# Patient Record
Sex: Male | Born: 2000 | Race: White | Hispanic: No | Marital: Single | Smoking: Never smoker
Health system: Southern US, Community
[De-identification: ages and names within clinical notes are randomized; demographics above are authoritative.]

## PROBLEM LIST (undated history)

## (undated) DIAGNOSIS — F32A Depression, unspecified: Secondary | ICD-10-CM

## (undated) DIAGNOSIS — F419 Anxiety disorder, unspecified: Secondary | ICD-10-CM

## (undated) HISTORY — PX: HERNIA REPAIR: SHX51

## (undated) HISTORY — DX: Depression, unspecified: F32.A

## (undated) HISTORY — DX: Anxiety disorder, unspecified: F41.9

## (undated) HISTORY — PX: ELBOW FRACTURE SURGERY: SHX616

---
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
## 2021-01-10 DIAGNOSIS — R1084 Generalized abdominal pain: Secondary | ICD-10-CM | POA: Diagnosis not present

## 2021-01-10 DIAGNOSIS — B360 Pityriasis versicolor: Secondary | ICD-10-CM | POA: Diagnosis not present

## 2021-01-10 DIAGNOSIS — R1032 Left lower quadrant pain: Secondary | ICD-10-CM | POA: Diagnosis not present

## 2021-01-14 DIAGNOSIS — S20314A Abrasion of middle front wall of thorax, initial encounter: Secondary | ICD-10-CM | POA: Diagnosis not present

## 2021-01-14 DIAGNOSIS — S299XXA Unspecified injury of thorax, initial encounter: Secondary | ICD-10-CM | POA: Diagnosis not present

## 2021-01-14 DIAGNOSIS — S199XXA Unspecified injury of neck, initial encounter: Secondary | ICD-10-CM | POA: Diagnosis not present

## 2021-01-14 DIAGNOSIS — S3993XA Unspecified injury of pelvis, initial encounter: Secondary | ICD-10-CM | POA: Diagnosis not present

## 2021-01-14 DIAGNOSIS — M25531 Pain in right wrist: Secondary | ICD-10-CM | POA: Diagnosis not present

## 2021-01-14 DIAGNOSIS — R52 Pain, unspecified: Secondary | ICD-10-CM | POA: Diagnosis not present

## 2021-01-14 DIAGNOSIS — S42411A Displaced simple supracondylar fracture without intercondylar fracture of right humerus, initial encounter for closed fracture: Secondary | ICD-10-CM | POA: Diagnosis not present

## 2021-01-14 DIAGNOSIS — S62514A Nondisplaced fracture of proximal phalanx of right thumb, initial encounter for closed fracture: Secondary | ICD-10-CM | POA: Diagnosis not present

## 2021-01-14 DIAGNOSIS — S52501A Unspecified fracture of the lower end of right radius, initial encounter for closed fracture: Secondary | ICD-10-CM | POA: Diagnosis not present

## 2021-01-14 DIAGNOSIS — M79644 Pain in right finger(s): Secondary | ICD-10-CM | POA: Diagnosis not present

## 2021-01-14 DIAGNOSIS — S3992XA Unspecified injury of lower back, initial encounter: Secondary | ICD-10-CM | POA: Diagnosis not present

## 2021-01-14 DIAGNOSIS — S53104A Unspecified dislocation of right ulnohumeral joint, initial encounter: Secondary | ICD-10-CM | POA: Diagnosis not present

## 2021-01-14 DIAGNOSIS — S40211A Abrasion of right shoulder, initial encounter: Secondary | ICD-10-CM | POA: Diagnosis not present

## 2021-01-14 DIAGNOSIS — S3991XA Unspecified injury of abdomen, initial encounter: Secondary | ICD-10-CM | POA: Diagnosis not present

## 2021-01-14 DIAGNOSIS — S62511A Displaced fracture of proximal phalanx of right thumb, initial encounter for closed fracture: Secondary | ICD-10-CM | POA: Diagnosis not present

## 2021-01-14 DIAGNOSIS — M25551 Pain in right hip: Secondary | ICD-10-CM | POA: Diagnosis not present

## 2021-01-14 DIAGNOSIS — S42491A Other displaced fracture of lower end of right humerus, initial encounter for closed fracture: Secondary | ICD-10-CM | POA: Diagnosis not present

## 2021-01-14 DIAGNOSIS — R0689 Other abnormalities of breathing: Secondary | ICD-10-CM | POA: Diagnosis not present

## 2021-01-14 DIAGNOSIS — M79641 Pain in right hand: Secondary | ICD-10-CM | POA: Diagnosis not present

## 2021-01-14 DIAGNOSIS — M25511 Pain in right shoulder: Secondary | ICD-10-CM | POA: Diagnosis not present

## 2021-01-14 DIAGNOSIS — Z20822 Contact with and (suspected) exposure to covid-19: Secondary | ICD-10-CM | POA: Diagnosis not present

## 2021-01-14 DIAGNOSIS — S0081XA Abrasion of other part of head, initial encounter: Secondary | ICD-10-CM | POA: Diagnosis not present

## 2021-01-14 DIAGNOSIS — I959 Hypotension, unspecified: Secondary | ICD-10-CM | POA: Diagnosis not present

## 2021-01-14 DIAGNOSIS — M25572 Pain in left ankle and joints of left foot: Secondary | ICD-10-CM | POA: Diagnosis not present

## 2021-01-14 DIAGNOSIS — S0990XA Unspecified injury of head, initial encounter: Secondary | ICD-10-CM | POA: Diagnosis not present

## 2021-01-14 DIAGNOSIS — M25521 Pain in right elbow: Secondary | ICD-10-CM | POA: Diagnosis not present

## 2021-01-18 DIAGNOSIS — S60511A Abrasion of right hand, initial encounter: Secondary | ICD-10-CM | POA: Diagnosis not present

## 2021-01-18 DIAGNOSIS — S52209A Unspecified fracture of shaft of unspecified ulna, initial encounter for closed fracture: Secondary | ICD-10-CM | POA: Diagnosis not present

## 2021-01-18 DIAGNOSIS — S62511A Displaced fracture of proximal phalanx of right thumb, initial encounter for closed fracture: Secondary | ICD-10-CM | POA: Diagnosis not present

## 2021-01-18 DIAGNOSIS — S52201A Unspecified fracture of shaft of right ulna, initial encounter for closed fracture: Secondary | ICD-10-CM | POA: Diagnosis not present

## 2021-01-18 DIAGNOSIS — S42401A Unspecified fracture of lower end of right humerus, initial encounter for closed fracture: Secondary | ICD-10-CM | POA: Diagnosis not present

## 2021-01-18 DIAGNOSIS — T148XXA Other injury of unspecified body region, initial encounter: Secondary | ICD-10-CM | POA: Diagnosis not present

## 2021-01-19 DIAGNOSIS — S42411A Displaced simple supracondylar fracture without intercondylar fracture of right humerus, initial encounter for closed fracture: Secondary | ICD-10-CM | POA: Diagnosis not present

## 2021-01-25 DIAGNOSIS — S42411A Displaced simple supracondylar fracture without intercondylar fracture of right humerus, initial encounter for closed fracture: Secondary | ICD-10-CM | POA: Diagnosis not present

## 2021-02-07 DIAGNOSIS — Z419 Encounter for procedure for purposes other than remedying health state, unspecified: Secondary | ICD-10-CM | POA: Diagnosis not present

## 2021-02-09 DIAGNOSIS — M25641 Stiffness of right hand, not elsewhere classified: Secondary | ICD-10-CM | POA: Diagnosis not present

## 2021-02-09 DIAGNOSIS — M25621 Stiffness of right elbow, not elsewhere classified: Secondary | ICD-10-CM | POA: Diagnosis not present

## 2021-02-09 DIAGNOSIS — Z4789 Encounter for other orthopedic aftercare: Secondary | ICD-10-CM | POA: Diagnosis not present

## 2021-02-09 DIAGNOSIS — S42411D Displaced simple supracondylar fracture without intercondylar fracture of right humerus, subsequent encounter for fracture with routine healing: Secondary | ICD-10-CM | POA: Diagnosis not present

## 2021-02-09 DIAGNOSIS — Z4689 Encounter for fitting and adjustment of other specified devices: Secondary | ICD-10-CM | POA: Diagnosis not present

## 2021-02-09 DIAGNOSIS — Z9889 Other specified postprocedural states: Secondary | ICD-10-CM | POA: Diagnosis not present

## 2021-02-16 DIAGNOSIS — S42411D Displaced simple supracondylar fracture without intercondylar fracture of right humerus, subsequent encounter for fracture with routine healing: Secondary | ICD-10-CM | POA: Diagnosis not present

## 2021-02-16 DIAGNOSIS — M25621 Stiffness of right elbow, not elsewhere classified: Secondary | ICD-10-CM | POA: Diagnosis not present

## 2021-02-16 DIAGNOSIS — M25641 Stiffness of right hand, not elsewhere classified: Secondary | ICD-10-CM | POA: Diagnosis not present

## 2021-02-16 DIAGNOSIS — Z4689 Encounter for fitting and adjustment of other specified devices: Secondary | ICD-10-CM | POA: Diagnosis not present

## 2021-02-16 DIAGNOSIS — Z9889 Other specified postprocedural states: Secondary | ICD-10-CM | POA: Diagnosis not present

## 2021-02-23 DIAGNOSIS — M25641 Stiffness of right hand, not elsewhere classified: Secondary | ICD-10-CM | POA: Diagnosis not present

## 2021-02-23 DIAGNOSIS — Z9889 Other specified postprocedural states: Secondary | ICD-10-CM | POA: Diagnosis not present

## 2021-02-23 DIAGNOSIS — Z8781 Personal history of (healed) traumatic fracture: Secondary | ICD-10-CM | POA: Diagnosis not present

## 2021-02-23 DIAGNOSIS — S42401D Unspecified fracture of lower end of right humerus, subsequent encounter for fracture with routine healing: Secondary | ICD-10-CM | POA: Diagnosis not present

## 2021-02-23 DIAGNOSIS — M25621 Stiffness of right elbow, not elsewhere classified: Secondary | ICD-10-CM | POA: Diagnosis not present

## 2021-02-23 DIAGNOSIS — S42411D Displaced simple supracondylar fracture without intercondylar fracture of right humerus, subsequent encounter for fracture with routine healing: Secondary | ICD-10-CM | POA: Diagnosis not present

## 2021-02-23 DIAGNOSIS — Z4689 Encounter for fitting and adjustment of other specified devices: Secondary | ICD-10-CM | POA: Diagnosis not present

## 2021-03-01 DIAGNOSIS — M25641 Stiffness of right hand, not elsewhere classified: Secondary | ICD-10-CM | POA: Diagnosis not present

## 2021-03-01 DIAGNOSIS — Z9889 Other specified postprocedural states: Secondary | ICD-10-CM | POA: Diagnosis not present

## 2021-03-01 DIAGNOSIS — S42411D Displaced simple supracondylar fracture without intercondylar fracture of right humerus, subsequent encounter for fracture with routine healing: Secondary | ICD-10-CM | POA: Diagnosis not present

## 2021-03-01 DIAGNOSIS — Z4689 Encounter for fitting and adjustment of other specified devices: Secondary | ICD-10-CM | POA: Diagnosis not present

## 2021-03-01 DIAGNOSIS — M25621 Stiffness of right elbow, not elsewhere classified: Secondary | ICD-10-CM | POA: Diagnosis not present

## 2021-03-07 DIAGNOSIS — Z4689 Encounter for fitting and adjustment of other specified devices: Secondary | ICD-10-CM | POA: Diagnosis not present

## 2021-03-07 DIAGNOSIS — M25641 Stiffness of right hand, not elsewhere classified: Secondary | ICD-10-CM | POA: Diagnosis not present

## 2021-03-07 DIAGNOSIS — S42411D Displaced simple supracondylar fracture without intercondylar fracture of right humerus, subsequent encounter for fracture with routine healing: Secondary | ICD-10-CM | POA: Diagnosis not present

## 2021-03-07 DIAGNOSIS — M25621 Stiffness of right elbow, not elsewhere classified: Secondary | ICD-10-CM | POA: Diagnosis not present

## 2021-03-07 DIAGNOSIS — Z9889 Other specified postprocedural states: Secondary | ICD-10-CM | POA: Diagnosis not present

## 2021-03-09 DIAGNOSIS — Z419 Encounter for procedure for purposes other than remedying health state, unspecified: Secondary | ICD-10-CM | POA: Diagnosis not present

## 2021-03-09 DIAGNOSIS — Z9889 Other specified postprocedural states: Secondary | ICD-10-CM | POA: Diagnosis not present

## 2021-03-09 DIAGNOSIS — S42411D Displaced simple supracondylar fracture without intercondylar fracture of right humerus, subsequent encounter for fracture with routine healing: Secondary | ICD-10-CM | POA: Diagnosis not present

## 2021-03-09 DIAGNOSIS — M25621 Stiffness of right elbow, not elsewhere classified: Secondary | ICD-10-CM | POA: Diagnosis not present

## 2021-03-09 DIAGNOSIS — Z8781 Personal history of (healed) traumatic fracture: Secondary | ICD-10-CM | POA: Diagnosis not present

## 2021-03-09 DIAGNOSIS — S42401D Unspecified fracture of lower end of right humerus, subsequent encounter for fracture with routine healing: Secondary | ICD-10-CM | POA: Diagnosis not present

## 2021-03-09 DIAGNOSIS — M25641 Stiffness of right hand, not elsewhere classified: Secondary | ICD-10-CM | POA: Diagnosis not present

## 2021-03-09 DIAGNOSIS — Z4689 Encounter for fitting and adjustment of other specified devices: Secondary | ICD-10-CM | POA: Diagnosis not present

## 2021-03-13 ENCOUNTER — Ambulatory Visit: Payer: Medicaid Other | Admitting: Nurse Practitioner

## 2021-03-14 DIAGNOSIS — M25621 Stiffness of right elbow, not elsewhere classified: Secondary | ICD-10-CM | POA: Diagnosis not present

## 2021-03-14 DIAGNOSIS — Z9889 Other specified postprocedural states: Secondary | ICD-10-CM | POA: Diagnosis not present

## 2021-03-14 DIAGNOSIS — S42411D Displaced simple supracondylar fracture without intercondylar fracture of right humerus, subsequent encounter for fracture with routine healing: Secondary | ICD-10-CM | POA: Diagnosis not present

## 2021-03-14 DIAGNOSIS — Z4689 Encounter for fitting and adjustment of other specified devices: Secondary | ICD-10-CM | POA: Diagnosis not present

## 2021-03-14 DIAGNOSIS — M25641 Stiffness of right hand, not elsewhere classified: Secondary | ICD-10-CM | POA: Diagnosis not present

## 2021-03-16 DIAGNOSIS — M25641 Stiffness of right hand, not elsewhere classified: Secondary | ICD-10-CM | POA: Diagnosis not present

## 2021-03-16 DIAGNOSIS — M25621 Stiffness of right elbow, not elsewhere classified: Secondary | ICD-10-CM | POA: Diagnosis not present

## 2021-03-16 DIAGNOSIS — Z4689 Encounter for fitting and adjustment of other specified devices: Secondary | ICD-10-CM | POA: Diagnosis not present

## 2021-03-16 DIAGNOSIS — Z9889 Other specified postprocedural states: Secondary | ICD-10-CM | POA: Diagnosis not present

## 2021-03-16 DIAGNOSIS — S42411D Displaced simple supracondylar fracture without intercondylar fracture of right humerus, subsequent encounter for fracture with routine healing: Secondary | ICD-10-CM | POA: Diagnosis not present

## 2021-03-20 ENCOUNTER — Ambulatory Visit: Payer: Medicaid Other | Admitting: Nurse Practitioner

## 2021-03-20 ENCOUNTER — Other Ambulatory Visit: Payer: Self-pay

## 2021-03-20 ENCOUNTER — Encounter: Payer: Self-pay | Admitting: Nurse Practitioner

## 2021-03-20 VITALS — BP 110/70 | HR 98 | Temp 97.7°F | Resp 18 | Ht 71.0 in | Wt 162.3 lb

## 2021-03-20 DIAGNOSIS — Z7689 Persons encountering health services in other specified circumstances: Secondary | ICD-10-CM

## 2021-03-20 DIAGNOSIS — F341 Dysthymic disorder: Secondary | ICD-10-CM | POA: Diagnosis not present

## 2021-03-20 MED ORDER — SERTRALINE HCL 25 MG PO TABS: 25.0000 mg | ORAL_TABLET | Freq: Every day | ORAL | 0 refills | Status: DC
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
## 2021-03-21 DIAGNOSIS — Z4689 Encounter for fitting and adjustment of other specified devices: Secondary | ICD-10-CM | POA: Diagnosis not present

## 2021-03-21 DIAGNOSIS — S42411D Displaced simple supracondylar fracture without intercondylar fracture of right humerus, subsequent encounter for fracture with routine healing: Secondary | ICD-10-CM | POA: Diagnosis not present

## 2021-03-21 DIAGNOSIS — Z9889 Other specified postprocedural states: Secondary | ICD-10-CM | POA: Diagnosis not present

## 2021-03-21 DIAGNOSIS — M25621 Stiffness of right elbow, not elsewhere classified: Secondary | ICD-10-CM | POA: Diagnosis not present

## 2021-03-21 DIAGNOSIS — M25641 Stiffness of right hand, not elsewhere classified: Secondary | ICD-10-CM | POA: Diagnosis not present

## 2021-03-28 DIAGNOSIS — M25621 Stiffness of right elbow, not elsewhere classified: Secondary | ICD-10-CM | POA: Diagnosis not present

## 2021-03-28 DIAGNOSIS — Z4689 Encounter for fitting and adjustment of other specified devices: Secondary | ICD-10-CM | POA: Diagnosis not present

## 2021-03-28 DIAGNOSIS — M25641 Stiffness of right hand, not elsewhere classified: Secondary | ICD-10-CM | POA: Diagnosis not present

## 2021-03-28 DIAGNOSIS — S42411D Displaced simple supracondylar fracture without intercondylar fracture of right humerus, subsequent encounter for fracture with routine healing: Secondary | ICD-10-CM | POA: Diagnosis not present

## 2021-03-28 DIAGNOSIS — Z9889 Other specified postprocedural states: Secondary | ICD-10-CM | POA: Diagnosis not present

## 2021-03-29 ENCOUNTER — Other Ambulatory Visit: Payer: Self-pay

## 2021-03-29 ENCOUNTER — Encounter: Payer: Self-pay | Admitting: Nurse Practitioner

## 2021-03-29 ENCOUNTER — Ambulatory Visit: Payer: Medicaid Other | Admitting: Nurse Practitioner

## 2021-03-29 VITALS — BP 112/70 | HR 91 | Temp 97.6°F | Resp 18 | Ht 71.0 in | Wt 161.9 lb

## 2021-03-29 DIAGNOSIS — H6123 Impacted cerumen, bilateral: Secondary | ICD-10-CM

## 2021-03-29 DIAGNOSIS — H60392 Other infective otitis externa, left ear: Secondary | ICD-10-CM | POA: Diagnosis not present

## 2021-03-29 MED ORDER — OFLOXACIN 0.3 % OT SOLN: 10.0000 [drp] | Freq: Every day | OTIC | 0 refills | Status: AC

## 2021-03-29 NOTE — Progress Notes (Signed)
BP 112/70    Pulse 91    Temp 97.6 F (36.4 C) (Oral)    Resp 18    Ht 5\' 11"  (1.803 m)    Wt 161 lb 14.4 oz (73.4 kg)    SpO2 99%    BMI 22.58 kg/m    Subjective:    Patient ID: Cesar Peterson, male    DOB: 2001/02/14, 20 y.o.   MRN: [id number]  HPI: Cesar Peterson is a 20 y.o. male, here alone  Chief Complaint  Patient presents with   Ear Fullness    Left ear   Left ear pain:  He says he noticed his left ear felt full with some pain and sounds were muffled.  Ear is impacted with cerumen. After performing ear lavage. He said that he had a bug climb in his ear last night.  There was no sign of a bug in his ear but it was red and irritated.  Will do antibiotic ear drops.   Right Ear:  He had a small amount of wax in his right ear and he would like 12 to do an ear lavage to that side too.    Relevant past medical, surgical, family and social history reviewed and updated as indicated. Interim medical history since our last visit reviewed. Allergies and medications reviewed and updated.  Review of Systems  Constitutional: Negative for fever or weight change.  HEENT: positive left ear pain Respiratory: Negative for cough and shortness of breath.   Cardiovascular: Negative for chest pain or palpitations.  Gastrointestinal: Negative for abdominal pain, no bowel changes.  Musculoskeletal: Negative for gait problem or joint swelling.  Skin: Negative for rash.  Neurological: Negative for dizziness or headache.  No other specific complaints in a complete review of systems (except as listed in HPI above).      Objective:    BP 112/70    Pulse 91    Temp 97.6 F (36.4 C) (Oral)    Resp 18    Ht 5\' 11"  (1.803 m)    Wt 161 lb 14.4 oz (73.4 kg)    SpO2 99%    BMI 22.58 kg/m   Wt Readings from Last 3 Encounters:  03/29/21 161 lb 14.4 oz (73.4 kg) (62 %, Z= 0.31)*  03/20/21 162 lb 4.8 oz (73.6 kg) (63 %, Z= 0.33)*  11/24/17 188 lb 2 oz (85.3 kg) (96 %, Z= 1.70)*   * Growth  percentiles are based on CDC (Boys, 2-20 Years) data.    Physical Exam  Constitutional: Patient appears well-developed and well-nourished. No distress.  HEENT: head atraumatic, normocephalic, pupils equal and reactive to light, ears right TM clear small amount of cerumen noted, left TM impacted with cerumen , after irrigation TM was clear and intact, ear canal was red and swollen, neck supple, throat within normal limits Cardiovascular: Normal rate, regular rhythm and normal heart sounds.  No murmur heard. No BLE edema. Pulmonary/Chest: Effort normal and breath sounds normal. No respiratory distress. Abdominal: Soft.  There is no tenderness. Psychiatric: Patient has a normal mood and affect. behavior is normal. Judgment and thought content normal.     Assessment & Plan:   1. Bilateral impacted cerumen - Ear Lavage  2. Other infective acute otitis externa of left ear  - ofloxacin (FLOXIN OTIC) 0.3 % OTIC solution; Place 10 drops into the left ear daily for 7 days.  Dispense: 3.5 mL; Refill: 0   Follow up plan: Return if symptoms worsen or  fail to improve.

## 2021-03-30 DIAGNOSIS — S42411D Displaced simple supracondylar fracture without intercondylar fracture of right humerus, subsequent encounter for fracture with routine healing: Secondary | ICD-10-CM | POA: Diagnosis not present

## 2021-03-30 DIAGNOSIS — Z4689 Encounter for fitting and adjustment of other specified devices: Secondary | ICD-10-CM | POA: Diagnosis not present

## 2021-03-30 DIAGNOSIS — M25621 Stiffness of right elbow, not elsewhere classified: Secondary | ICD-10-CM | POA: Diagnosis not present

## 2021-03-30 DIAGNOSIS — M25641 Stiffness of right hand, not elsewhere classified: Secondary | ICD-10-CM | POA: Diagnosis not present

## 2021-03-30 DIAGNOSIS — Z9889 Other specified postprocedural states: Secondary | ICD-10-CM | POA: Diagnosis not present

## 2021-04-04 DIAGNOSIS — M25641 Stiffness of right hand, not elsewhere classified: Secondary | ICD-10-CM | POA: Diagnosis not present

## 2021-04-04 DIAGNOSIS — S42411D Displaced simple supracondylar fracture without intercondylar fracture of right humerus, subsequent encounter for fracture with routine healing: Secondary | ICD-10-CM | POA: Diagnosis not present

## 2021-04-04 DIAGNOSIS — Z9889 Other specified postprocedural states: Secondary | ICD-10-CM | POA: Diagnosis not present

## 2021-04-04 DIAGNOSIS — Z4689 Encounter for fitting and adjustment of other specified devices: Secondary | ICD-10-CM | POA: Diagnosis not present

## 2021-04-04 DIAGNOSIS — M25621 Stiffness of right elbow, not elsewhere classified: Secondary | ICD-10-CM | POA: Diagnosis not present

## 2021-04-06 DIAGNOSIS — M25641 Stiffness of right hand, not elsewhere classified: Secondary | ICD-10-CM | POA: Diagnosis not present

## 2021-04-06 DIAGNOSIS — M25621 Stiffness of right elbow, not elsewhere classified: Secondary | ICD-10-CM | POA: Diagnosis not present

## 2021-04-06 DIAGNOSIS — S42411D Displaced simple supracondylar fracture without intercondylar fracture of right humerus, subsequent encounter for fracture with routine healing: Secondary | ICD-10-CM | POA: Diagnosis not present

## 2021-04-06 DIAGNOSIS — Z4689 Encounter for fitting and adjustment of other specified devices: Secondary | ICD-10-CM | POA: Diagnosis not present

## 2021-04-06 DIAGNOSIS — Z9889 Other specified postprocedural states: Secondary | ICD-10-CM | POA: Diagnosis not present

## 2021-04-09 DIAGNOSIS — Z419 Encounter for procedure for purposes other than remedying health state, unspecified: Secondary | ICD-10-CM | POA: Diagnosis not present

## 2021-04-13 DIAGNOSIS — S42411D Displaced simple supracondylar fracture without intercondylar fracture of right humerus, subsequent encounter for fracture with routine healing: Secondary | ICD-10-CM | POA: Diagnosis not present

## 2021-04-13 DIAGNOSIS — Z9889 Other specified postprocedural states: Secondary | ICD-10-CM | POA: Diagnosis not present

## 2021-04-13 DIAGNOSIS — M25621 Stiffness of right elbow, not elsewhere classified: Secondary | ICD-10-CM | POA: Diagnosis not present

## 2021-04-13 DIAGNOSIS — M25641 Stiffness of right hand, not elsewhere classified: Secondary | ICD-10-CM | POA: Diagnosis not present

## 2021-04-13 DIAGNOSIS — Z4689 Encounter for fitting and adjustment of other specified devices: Secondary | ICD-10-CM | POA: Diagnosis not present

## 2021-04-18 DIAGNOSIS — S42411D Displaced simple supracondylar fracture without intercondylar fracture of right humerus, subsequent encounter for fracture with routine healing: Secondary | ICD-10-CM | POA: Diagnosis not present

## 2021-04-18 DIAGNOSIS — Z4689 Encounter for fitting and adjustment of other specified devices: Secondary | ICD-10-CM | POA: Diagnosis not present

## 2021-04-18 DIAGNOSIS — Z9889 Other specified postprocedural states: Secondary | ICD-10-CM | POA: Diagnosis not present

## 2021-04-18 DIAGNOSIS — M25621 Stiffness of right elbow, not elsewhere classified: Secondary | ICD-10-CM | POA: Diagnosis not present

## 2021-04-18 DIAGNOSIS — M25641 Stiffness of right hand, not elsewhere classified: Secondary | ICD-10-CM | POA: Diagnosis not present

## 2021-04-20 ENCOUNTER — Encounter: Payer: Self-pay | Admitting: Nurse Practitioner

## 2021-04-20 ENCOUNTER — Ambulatory Visit: Payer: Medicaid Other | Admitting: Nurse Practitioner

## 2021-04-20 VITALS — BP 122/78 | HR 98 | Temp 97.7°F | Resp 16 | Ht 71.0 in | Wt 160.6 lb

## 2021-04-20 DIAGNOSIS — F341 Dysthymic disorder: Secondary | ICD-10-CM

## 2021-04-20 DIAGNOSIS — F419 Anxiety disorder, unspecified: Secondary | ICD-10-CM | POA: Diagnosis not present

## 2021-04-20 DIAGNOSIS — L03012 Cellulitis of left finger: Secondary | ICD-10-CM | POA: Diagnosis not present

## 2021-04-20 MED ORDER — CEPHALEXIN 500 MG PO CAPS: 500.0000 mg | ORAL_CAPSULE | Freq: Four times a day (QID) | ORAL | 0 refills | Status: AC

## 2021-04-20 MED ORDER — SERTRALINE HCL 50 MG PO TABS: 50.0000 mg | ORAL_TABLET | Freq: Every day | ORAL | 0 refills | Status: DC
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
## 2021-04-27 DIAGNOSIS — Z4689 Encounter for fitting and adjustment of other specified devices: Secondary | ICD-10-CM | POA: Diagnosis not present

## 2021-04-27 DIAGNOSIS — S42411D Displaced simple supracondylar fracture without intercondylar fracture of right humerus, subsequent encounter for fracture with routine healing: Secondary | ICD-10-CM | POA: Diagnosis not present

## 2021-04-27 DIAGNOSIS — Z9889 Other specified postprocedural states: Secondary | ICD-10-CM | POA: Diagnosis not present

## 2021-04-27 DIAGNOSIS — M25641 Stiffness of right hand, not elsewhere classified: Secondary | ICD-10-CM | POA: Diagnosis not present

## 2021-04-27 DIAGNOSIS — M25621 Stiffness of right elbow, not elsewhere classified: Secondary | ICD-10-CM | POA: Diagnosis not present

## 2021-04-27 DIAGNOSIS — Z8781 Personal history of (healed) traumatic fracture: Secondary | ICD-10-CM | POA: Diagnosis not present

## 2021-04-27 DIAGNOSIS — S42401A Unspecified fracture of lower end of right humerus, initial encounter for closed fracture: Secondary | ICD-10-CM | POA: Diagnosis not present

## 2021-05-02 DIAGNOSIS — S42411D Displaced simple supracondylar fracture without intercondylar fracture of right humerus, subsequent encounter for fracture with routine healing: Secondary | ICD-10-CM | POA: Diagnosis not present

## 2021-05-02 DIAGNOSIS — M25621 Stiffness of right elbow, not elsewhere classified: Secondary | ICD-10-CM | POA: Diagnosis not present

## 2021-05-02 DIAGNOSIS — Z9889 Other specified postprocedural states: Secondary | ICD-10-CM | POA: Diagnosis not present

## 2021-05-02 DIAGNOSIS — M25641 Stiffness of right hand, not elsewhere classified: Secondary | ICD-10-CM | POA: Diagnosis not present

## 2021-05-02 DIAGNOSIS — Z4689 Encounter for fitting and adjustment of other specified devices: Secondary | ICD-10-CM | POA: Diagnosis not present

## 2021-05-04 DIAGNOSIS — M25621 Stiffness of right elbow, not elsewhere classified: Secondary | ICD-10-CM | POA: Diagnosis not present

## 2021-05-04 DIAGNOSIS — Z9889 Other specified postprocedural states: Secondary | ICD-10-CM | POA: Diagnosis not present

## 2021-05-04 DIAGNOSIS — M25641 Stiffness of right hand, not elsewhere classified: Secondary | ICD-10-CM | POA: Diagnosis not present

## 2021-05-04 DIAGNOSIS — Z4689 Encounter for fitting and adjustment of other specified devices: Secondary | ICD-10-CM | POA: Diagnosis not present

## 2021-05-04 DIAGNOSIS — S42411D Displaced simple supracondylar fracture without intercondylar fracture of right humerus, subsequent encounter for fracture with routine healing: Secondary | ICD-10-CM | POA: Diagnosis not present

## 2021-05-10 DIAGNOSIS — Z419 Encounter for procedure for purposes other than remedying health state, unspecified: Secondary | ICD-10-CM | POA: Diagnosis not present

## 2021-05-18 ENCOUNTER — Ambulatory Visit: Payer: Medicaid Other | Admitting: Nurse Practitioner

## 2021-05-18 ENCOUNTER — Other Ambulatory Visit: Payer: Self-pay

## 2021-05-18 ENCOUNTER — Encounter: Payer: Self-pay | Admitting: Nurse Practitioner

## 2021-05-18 VITALS — BP 118/72 | HR 92 | Temp 97.8°F | Resp 18 | Ht 71.0 in | Wt 159.0 lb

## 2021-05-18 DIAGNOSIS — M25621 Stiffness of right elbow, not elsewhere classified: Secondary | ICD-10-CM | POA: Diagnosis not present

## 2021-05-18 DIAGNOSIS — F329 Major depressive disorder, single episode, unspecified: Secondary | ICD-10-CM | POA: Diagnosis not present

## 2021-05-18 DIAGNOSIS — F419 Anxiety disorder, unspecified: Secondary | ICD-10-CM | POA: Diagnosis not present

## 2021-05-18 DIAGNOSIS — Z9889 Other specified postprocedural states: Secondary | ICD-10-CM | POA: Diagnosis not present

## 2021-05-18 DIAGNOSIS — S42411D Displaced simple supracondylar fracture without intercondylar fracture of right humerus, subsequent encounter for fracture with routine healing: Secondary | ICD-10-CM | POA: Diagnosis not present

## 2021-05-18 DIAGNOSIS — Z4689 Encounter for fitting and adjustment of other specified devices: Secondary | ICD-10-CM | POA: Diagnosis not present

## 2021-05-18 DIAGNOSIS — M25641 Stiffness of right hand, not elsewhere classified: Secondary | ICD-10-CM | POA: Diagnosis not present

## 2021-05-18 MED ORDER — DULOXETINE HCL 30 MG PO CPEP: 30.0000 mg | ORAL_CAPSULE | Freq: Every day | ORAL | 0 refills | Status: DC

## 2021-05-18 MED ORDER — BUSPIRONE HCL 5 MG PO TABS: 5.0000 mg | ORAL_TABLET | Freq: Three times a day (TID) | ORAL | 0 refills | Status: DC | PRN
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
## 2021-05-25 DIAGNOSIS — M25641 Stiffness of right hand, not elsewhere classified: Secondary | ICD-10-CM | POA: Diagnosis not present

## 2021-05-25 DIAGNOSIS — M25621 Stiffness of right elbow, not elsewhere classified: Secondary | ICD-10-CM | POA: Diagnosis not present

## 2021-05-25 DIAGNOSIS — S42411D Displaced simple supracondylar fracture without intercondylar fracture of right humerus, subsequent encounter for fracture with routine healing: Secondary | ICD-10-CM | POA: Diagnosis not present

## 2021-05-25 DIAGNOSIS — Z4689 Encounter for fitting and adjustment of other specified devices: Secondary | ICD-10-CM | POA: Diagnosis not present

## 2021-05-25 DIAGNOSIS — Z9889 Other specified postprocedural states: Secondary | ICD-10-CM | POA: Diagnosis not present

## 2021-06-01 DIAGNOSIS — Z4689 Encounter for fitting and adjustment of other specified devices: Secondary | ICD-10-CM | POA: Diagnosis not present

## 2021-06-01 DIAGNOSIS — S42411D Displaced simple supracondylar fracture without intercondylar fracture of right humerus, subsequent encounter for fracture with routine healing: Secondary | ICD-10-CM | POA: Diagnosis not present

## 2021-06-01 DIAGNOSIS — Z9889 Other specified postprocedural states: Secondary | ICD-10-CM | POA: Diagnosis not present

## 2021-06-01 DIAGNOSIS — M25621 Stiffness of right elbow, not elsewhere classified: Secondary | ICD-10-CM | POA: Diagnosis not present

## 2021-06-01 DIAGNOSIS — M25641 Stiffness of right hand, not elsewhere classified: Secondary | ICD-10-CM | POA: Diagnosis not present

## 2021-06-07 DIAGNOSIS — Z419 Encounter for procedure for purposes other than remedying health state, unspecified: Secondary | ICD-10-CM | POA: Diagnosis not present

## 2021-06-08 DIAGNOSIS — S42411D Displaced simple supracondylar fracture without intercondylar fracture of right humerus, subsequent encounter for fracture with routine healing: Secondary | ICD-10-CM | POA: Diagnosis not present

## 2021-06-08 DIAGNOSIS — M25621 Stiffness of right elbow, not elsewhere classified: Secondary | ICD-10-CM | POA: Diagnosis not present

## 2021-06-08 DIAGNOSIS — M25641 Stiffness of right hand, not elsewhere classified: Secondary | ICD-10-CM | POA: Diagnosis not present

## 2021-06-29 DIAGNOSIS — M25621 Stiffness of right elbow, not elsewhere classified: Secondary | ICD-10-CM | POA: Diagnosis not present

## 2021-06-29 DIAGNOSIS — S42411D Displaced simple supracondylar fracture without intercondylar fracture of right humerus, subsequent encounter for fracture with routine healing: Secondary | ICD-10-CM | POA: Diagnosis not present

## 2021-06-29 DIAGNOSIS — M25641 Stiffness of right hand, not elsewhere classified: Secondary | ICD-10-CM | POA: Diagnosis not present

## 2021-07-03 ENCOUNTER — Other Ambulatory Visit: Payer: Self-pay | Admitting: Nurse Practitioner

## 2021-07-03 DIAGNOSIS — F419 Anxiety disorder, unspecified: Secondary | ICD-10-CM

## 2021-07-03 DIAGNOSIS — F329 Major depressive disorder, single episode, unspecified: Secondary | ICD-10-CM

## 2021-07-03 MED ORDER — BUSPIRONE HCL 5 MG PO TABS: 5.0000 mg | ORAL_TABLET | Freq: Three times a day (TID) | ORAL | 1 refills | Status: DC | PRN

## 2021-07-03 MED ORDER — DULOXETINE HCL 60 MG PO CPEP: 60.0000 mg | ORAL_CAPSULE | Freq: Every day | ORAL | 1 refills | Status: DC

## 2021-07-06 ENCOUNTER — Other Ambulatory Visit: Payer: Self-pay

## 2021-07-06 ENCOUNTER — Encounter: Payer: Self-pay | Admitting: Nurse Practitioner

## 2021-07-06 ENCOUNTER — Ambulatory Visit: Payer: Medicaid Other | Admitting: Nurse Practitioner

## 2021-07-06 VITALS — BP 124/82 | HR 97 | Temp 97.6°F | Resp 18 | Ht 71.0 in | Wt 165.7 lb

## 2021-07-06 DIAGNOSIS — F5105 Insomnia due to other mental disorder: Secondary | ICD-10-CM

## 2021-07-06 DIAGNOSIS — F329 Major depressive disorder, single episode, unspecified: Secondary | ICD-10-CM | POA: Diagnosis not present

## 2021-07-06 DIAGNOSIS — F419 Anxiety disorder, unspecified: Secondary | ICD-10-CM

## 2021-07-06 DIAGNOSIS — F99 Mental disorder, not otherwise specified: Secondary | ICD-10-CM | POA: Diagnosis not present

## 2021-07-06 MED ORDER — HYDROXYZINE PAMOATE 25 MG PO CAPS: 25.0000 mg | ORAL_CAPSULE | Freq: Every evening | ORAL | 0 refills | Status: DC | PRN

## 2021-07-06 NOTE — Progress Notes (Signed)
? ?BP 124/82   Pulse 97   Temp 97.6 ?F (36.4 ?C) (Oral)   Resp 18   Ht 5\' 11"  (1.803 m)   Wt 165 lb 11.2 oz (75.2 kg)   SpO2 99%   BMI 23.11 kg/m?   ? ?Subjective:  ? ? Patient ID: Cesar Peterson, male    DOB: 11/06/2000, 21 y.o.   MRN: [id number] ? ?HPI: ?Cesar Peterson is a 21 y.o. male ? ?Chief Complaint  ?Patient presents with  ? Depression  ?  Follow up  ? ?Depression/ anxiety: He was last seen on 05/18/2021 at which time we switched him from Zoloft to Cymbalta 60 mg daily.  He was supposed to come back for a four week check but did not come, so he ran out of his medication.  He recently restarted it. We also started him on Buspar due to his anxiety about getting into a car. He says he has noticed a difference in his anxiety since starting the buspar. His PHQ9 score has improved slightly.  Will continue with current treatment since being out of medication for a while.  Follow-up in four weeks to see how he is doing.  ? ? ?  07/06/2021  ?  1:11 PM 05/18/2021  ? 10:25 AM 04/20/2021  ?  8:30 AM 03/29/2021  ?  1:47 PM 03/20/2021  ?  8:42 AM  ?Depression screen PHQ 2/9  ?Decreased Interest 3 2 0 1 1  ?Down, Depressed, Hopeless 3 2 3 3 3   ?PHQ - 2 Score 6 4 3 4 4   ?Altered sleeping 3 3 3 3 3   ?Tired, decreased energy 2 2 2 2 2   ?Change in appetite 2 3 3 3 2   ?Feeling bad or failure about yourself  2 3 3 3 2   ?Trouble concentrating 0 2 0 0 0  ?Moving slowly or fidgety/restless 0 0 0 0 0  ?Suicidal thoughts 0 2 1 1  0  ?PHQ-9 Score 15 19 15 16 13   ?Difficult doing work/chores Somewhat difficult Very difficult Somewhat difficult  Somewhat difficult  ?  ? ?  07/06/2021  ?  1:12 PM 05/18/2021  ? 10:26 AM 04/20/2021  ?  8:32 AM 03/29/2021  ?  1:48 PM  ?GAD 7 : Generalized Anxiety Score  ?Nervous, Anxious, on Edge 2 2 3 2   ?Control/stop worrying 3 2 3 3   ?Worry too much - different things 3 2 3 3   ?Trouble relaxing 1 1 1 2   ?Restless 0 1 0 0  ?Easily annoyed or irritable 2 1 0 0  ?Afraid - awful might happen 2 2 0 2  ?Total  GAD 7 Score 13 11 10 12   ?Anxiety Difficulty Somewhat difficult Very difficult Somewhat difficult Somewhat difficult  ? ? Insomnia:  He says he has been having a hard time falling asleep and staying asleep.  He says this has been an issues for awhile. He has tried melatonin. Discussed avoiding caffeine and staying off electronics about an hour before bed.  Will start hydroxyzine at bedtime.  ? ?Relevant past medical, surgical, family and social history reviewed and updated as indicated. Interim medical history since our last visit reviewed. ?Allergies and medications reviewed and updated. ? ?Review of Systems ? ?Constitutional: Negative for fever or weight change.  ?Respiratory: Negative for cough and shortness of breath.   ?Cardiovascular: Negative for chest pain or palpitations.  ?Gastrointestinal: Negative for abdominal pain, no bowel changes.  ?Musculoskeletal: Negative for gait problem or joint swelling.  ?  Skin: Negative for rash.  ?Neurological: Negative for dizziness or headache.  ?No other specific complaints in a complete review of systems (except as listed in HPI above).  ? ?   ?Objective:  ?  ?BP 124/82   Pulse 97   Temp 97.6 ?F (36.4 ?C) (Oral)   Resp 18   Ht 5\' 11"  (1.803 m)   Wt 165 lb 11.2 oz (75.2 kg)   SpO2 99%   BMI 23.11 kg/m?   ?Wt Readings from Last 3 Encounters:  ?07/06/21 165 lb 11.2 oz (75.2 kg) (66 %, Z= 0.42)*  ?05/18/21 159 lb (72.1 kg) (57 %, Z= 0.19)*  ?04/20/21 160 lb 9.6 oz (72.8 kg) (60 %, Z= 0.26)*  ? ?* Growth percentiles are based on CDC (Boys, 2-20 Years) data.  ?  ?Physical Exam ? ?Constitutional: Patient appears well-developed and well-nourished.  No distress.  ?HEENT: head atraumatic, normocephalic, pupils equal and reactive to light, neck supple ?Cardiovascular: Normal rate, regular rhythm and normal heart sounds.  No murmur heard. No BLE edema. ?Pulmonary/Chest: Effort normal and breath sounds normal. No respiratory distress. ?Abdominal: Soft.  There is no  tenderness. ?Psychiatric: Patient has a normal mood and affect. behavior is normal. Judgment and thought content normal.  ? ?   ?Assessment & Plan:  ? ?1. Major depressive disorder with current active episode, moderate depression episode severity, recurrent ?-continue Cymbalta ? ?2. Anxiety ? ?- hydrOXYzine (VISTARIL) 25 MG capsule; Take 1 capsule (25 mg total) by mouth at bedtime as needed for anxiety.  Dispense: 30 capsule; Refill: 0 ? ?3. Insomnia due to other mental disorder ? ?- hydrOXYzine (VISTARIL) 25 MG capsule; Take 1 capsule (25 mg total) by mouth at bedtime as needed for anxiety.  Dispense: 30 capsule; Refill: 0  ? ?Follow up plan: ?Return in about 4 weeks (around 08/03/2021) for follow up. ? ? ? ? ? ?

## 2021-07-08 DIAGNOSIS — Z419 Encounter for procedure for purposes other than remedying health state, unspecified: Secondary | ICD-10-CM | POA: Diagnosis not present

## 2021-07-29 ENCOUNTER — Other Ambulatory Visit: Payer: Self-pay | Admitting: Nurse Practitioner

## 2021-07-29 DIAGNOSIS — F99 Mental disorder, not otherwise specified: Secondary | ICD-10-CM

## 2021-07-29 DIAGNOSIS — F419 Anxiety disorder, unspecified: Secondary | ICD-10-CM

## 2021-07-31 NOTE — Telephone Encounter (Signed)
Requested medication (s) are due for refill today:Due 08/06/21 ? ?Requested medication (s) are on the active medication list: yes   ? ?Last refill: 07/06/21  #30 0 refills ? ?Future visit scheduled no ? ?Notes to clinic:failed due to labs, please review. Thank you. ? ?Requested Prescriptions  ?Pending Prescriptions Disp Refills  ? hydrOXYzine (VISTARIL) 25 MG capsule [Pharmacy Med Name: hydrOXYzine Pamoate 25 MG Oral Capsule] 30 capsule 0  ?  Sig: TAKE 1 CAPSULE BY MOUTH AT BEDTIME AS NEEDED FOR ANXIETY  ?  ? Ear, Nose, and Throat:  Antihistamines 2 Failed - 07/29/2021  2:12 PM  ?  ?  Failed - Cr in normal range and within 360 days  ?  No results found for: CREATININE, LABCREAU, LABCREA, POCCRE  ?  ?  ?  Passed - Valid encounter within last 12 months  ?  Recent Outpatient Visits   ? ?      ? 3 weeks ago Major depressive disorder with current active episode, moderate depression episode severity, recurrent  ? [place], FNP  ? 2 months ago Major depressive disorder with current active episode, moderate depression episode severity, recurrent  ? [place], FNP  ? 3 months ago Persistent depressive disorder  ? [place], FNP  ? 4 months ago Bilateral impacted cerumen  ? [place], FNP  ? 4 months ago Persistent depressive disorder  ? [facility] Serafina Royals F, FNP  ? ?  ?  ? ? ?  ?  ?  ? ? ? ? ?

## 2021-08-07 DIAGNOSIS — Z419 Encounter for procedure for purposes other than remedying health state, unspecified: Secondary | ICD-10-CM | POA: Diagnosis not present

## 2021-08-18 ENCOUNTER — Ambulatory Visit (INDEPENDENT_AMBULATORY_CARE_PROVIDER_SITE_OTHER): Payer: Medicaid Other | Admitting: Nurse Practitioner

## 2021-08-18 ENCOUNTER — Other Ambulatory Visit: Payer: Self-pay

## 2021-08-18 ENCOUNTER — Encounter: Payer: Self-pay | Admitting: Nurse Practitioner

## 2021-08-18 VITALS — BP 120/84 | HR 99 | Temp 97.9°F | Resp 18 | Ht 71.0 in | Wt 170.1 lb

## 2021-08-18 DIAGNOSIS — F419 Anxiety disorder, unspecified: Secondary | ICD-10-CM | POA: Diagnosis not present

## 2021-08-18 DIAGNOSIS — R1032 Left lower quadrant pain: Secondary | ICD-10-CM | POA: Diagnosis not present

## 2021-08-18 DIAGNOSIS — F99 Mental disorder, not otherwise specified: Secondary | ICD-10-CM | POA: Insufficient documentation

## 2021-08-18 DIAGNOSIS — F329 Major depressive disorder, single episode, unspecified: Secondary | ICD-10-CM

## 2021-08-18 DIAGNOSIS — F5105 Insomnia due to other mental disorder: Secondary | ICD-10-CM | POA: Diagnosis not present

## 2021-08-18 MED ORDER — DULOXETINE HCL 20 MG PO CPEP: 20.0000 mg | ORAL_CAPSULE | Freq: Every day | ORAL | 0 refills | Status: DC

## 2021-08-18 MED ORDER — BUSPIRONE HCL 5 MG PO TABS: 5.0000 mg | ORAL_TABLET | Freq: Three times a day (TID) | ORAL | 1 refills | Status: DC | PRN

## 2021-08-18 MED ORDER — DULOXETINE HCL 60 MG PO CPEP: 60.0000 mg | ORAL_CAPSULE | Freq: Every day | ORAL | 0 refills | Status: DC

## 2021-08-18 MED ORDER — HYDROXYZINE PAMOATE 25 MG PO CAPS: ORAL_CAPSULE | ORAL | 1 refills | Status: DC

## 2021-08-18 NOTE — Progress Notes (Signed)
? ?BP 120/84   Pulse 99   Temp 97.9 ?F (36.6 ?C) (Oral)   Resp 18   Ht 5\' 11"  (1.803 m)   Wt 170 lb 1.6 oz (77.2 kg)   SpO2 99%   BMI 23.72 kg/m?   ? ?Subjective:  ? ? Patient ID: Cesar Peterson, male    DOB: 2001/01/29, 21 y.o.   MRN: [id number] ? ?HPI: ?Cesar Peterson is a 21 y.o. male ? ?Chief Complaint  ?Patient presents with  ? Depression  ?  6 week recheck  ? ?Depression/anxiety: Last seen on 07/06/2021.  Has been on Cymbalta 60 mg daily.  He is also taking BuSpar due to his anxiety about getting into a car.  He continues to notice a difference in his anxiety since starting the BuSpar.  His PHQ-9 score has not changed from last visit.  His GAD score has improved.  Will increase dose of Cymbalta to 80 mg daily. ? ?  08/18/2021  ?  8:32 AM 07/06/2021  ?  1:11 PM 05/18/2021  ? 10:25 AM 04/20/2021  ?  8:30 AM 03/29/2021  ?  1:47 PM  ?Depression screen PHQ 2/9  ?Decreased Interest 3 3 2  0 1  ?Down, Depressed, Hopeless 3 3 2 3 3   ?PHQ - 2 Score 6 6 4 3 4   ?Altered sleeping 3 3 3 3 3   ?Tired, decreased energy 2 2 2 2 2   ?Change in appetite 2 2 3 3 3   ?Feeling bad or failure about yourself  1 2 3 3 3   ?Trouble concentrating 0 0 2 0 0  ?Moving slowly or fidgety/restless 1 0 0 0 0  ?Suicidal thoughts  0 2 1 1   ?PHQ-9 Score 15 15 19 15 16   ?Difficult doing work/chores Very difficult Somewhat difficult Very difficult Somewhat difficult   ? ? ?  08/18/2021  ?  8:32 AM 07/06/2021  ?  1:12 PM 05/18/2021  ? 10:26 AM 04/20/2021  ?  8:32 AM  ?GAD 7 : Generalized Anxiety Score  ?Nervous, Anxious, on Edge 1 2 2 3   ?Control/stop worrying 2 3 2 3   ?Worry too much - different things 3 3 2 3   ?Trouble relaxing 1 1 1 1   ?Restless 1 0 1 0  ?Easily annoyed or irritable 1 2 1  0  ?Afraid - awful might happen 1 2 2  0  ?Total GAD 7 Score 10 13 11 10   ?Anxiety Difficulty Somewhat difficult Somewhat difficult Very difficult Somewhat difficult  ? ?Insomnia: At last visit he discussed that he was having a hard time falling asleep and staying  asleep.  He says that had been an issue for a while.  Melatonin but it did not help we had discussed avoiding caffeine and staying off electronics at about an hour before bed.  We started him on hydroxyzine at bedtime to see if that made a difference.  He says he has been sleeping better at night.  But not every night. ? ?Left groin pain/lump: Patient reports he has had a hernia in this area before.  Has had surgery previously.  He says he has had this lump for quite a while now.  He says is becoming more pronounced and is starting to give him some pain.  Denies any urinary symptoms, testicular pain or fever.  Placed referral to general surgery. ?  ?Relevant past medical, surgical, family and social history reviewed and updated as indicated. Interim medical history since our last visit reviewed. ?Allergies and  medications reviewed and updated. ? ?Review of Systems ? ?Constitutional: Negative for fever or weight change.  ?Respiratory: Negative for cough and shortness of breath.   ?Cardiovascular: Negative for chest pain or palpitations.  ?Gastrointestinal: Negative for abdominal pain, no bowel changes.  ?Musculoskeletal: Negative for gait problem or joint swelling.  ?Skin: Negative for rash.  ?Neurological: Negative for dizziness or headache.  ?No other specific complaints in a complete review of systems (except as listed in HPI above).  ? ?   ?Objective:  ?  ?BP 120/84   Pulse 99   Temp 97.9 ?F (36.6 ?C) (Oral)   Resp 18   Ht 5\' 11"  (1.803 m)   Wt 170 lb 1.6 oz (77.2 kg)   SpO2 99%   BMI 23.72 kg/m?   ?Wt Readings from Last 3 Encounters:  ?08/18/21 170 lb 1.6 oz (77.2 kg) (71 %, Z= 0.55)*  ?07/06/21 165 lb 11.2 oz (75.2 kg) (66 %, Z= 0.42)*  ?05/18/21 159 lb (72.1 kg) (57 %, Z= 0.19)*  ? ?* Growth percentiles are based on CDC (Boys, 2-20 Years) data.  ?  ?Physical Exam ? ?Constitutional: Patient appears well-developed and well-nourished.  No distress.  ?HEENT: head atraumatic, normocephalic, pupils equal and  reactive to light, neck supple ?Cardiovascular: Normal rate, regular rhythm and normal heart sounds.  No murmur heard. No BLE edema. ?Pulmonary/Chest: Effort normal and breath sounds normal. No respiratory distress. ?Abdominal: Soft.  There is no tenderness.   ?Left groin area: lump in left groin ?Psychiatric: Patient has a normal mood and affect. behavior is normal. Judgment and thought content normal.  ?No results found for this or any previous visit. ?   ?Assessment & Plan:  ? ?Problem List Items Addressed This Visit   ? ?  ? Other  ? Major depressive disorder with current active episode - Primary  ?  PHQ 9 no improvement from last visit.  Increase Cymbalta to 80 mg daily. ? ?  ?  ? Relevant Medications  ? DULoxetine (CYMBALTA) 20 MG capsule  ? DULoxetine (CYMBALTA) 60 MG capsule  ? busPIRone (BUSPAR) 5 MG tablet  ? hydrOXYzine (VISTARIL) 25 MG capsule  ? Anxiety  ?  GAD score has improved.  Patient reports BuSpar helping throughout the day.  Continue current treatment plan. ? ?  ?  ? Relevant Medications  ? DULoxetine (CYMBALTA) 20 MG capsule  ? DULoxetine (CYMBALTA) 60 MG capsule  ? busPIRone (BUSPAR) 5 MG tablet  ? hydrOXYzine (VISTARIL) 25 MG capsule  ? Insomnia due to other mental disorder  ?  Patient reports he has been sleeping better although not every night.  Continue taking hydroxyzine 25 mg at bedtime. ? ?  ?  ? Relevant Medications  ? hydrOXYzine (VISTARIL) 25 MG capsule  ? ?Other Visit Diagnoses   ? ? Left groin pain      ? possible hernia, history of hernia in same area.  Placed referral to general surgery  ? Relevant Orders  ? Ambulatory referral to General Surgery  ? ?  ?  ? ?Follow up plan: ?Return in about 4 weeks (around 09/15/2021) for follow up. ? ? ? ? ? ?

## 2021-08-18 NOTE — Assessment & Plan Note (Signed)
GAD score has improved.  Patient reports BuSpar helping throughout the day.  Continue current treatment plan. ?

## 2021-08-18 NOTE — Assessment & Plan Note (Signed)
PHQ 9 no improvement from last visit.  Increase Cymbalta to 80 mg daily. ?

## 2021-08-18 NOTE — Assessment & Plan Note (Signed)
Patient reports he has been sleeping better although not every night.  Continue taking hydroxyzine 25 mg at bedtime. ?

## 2021-08-24 ENCOUNTER — Ambulatory Visit (INDEPENDENT_AMBULATORY_CARE_PROVIDER_SITE_OTHER): Payer: Medicaid Other | Admitting: Surgery

## 2021-08-24 ENCOUNTER — Encounter: Payer: Self-pay | Admitting: Surgery

## 2021-08-24 VITALS — BP 122/79 | HR 108 | Temp 97.9°F | Ht 72.0 in | Wt 167.2 lb

## 2021-08-24 DIAGNOSIS — R1032 Left lower quadrant pain: Secondary | ICD-10-CM | POA: Diagnosis not present

## 2021-08-24 NOTE — Patient Instructions (Addendum)
Your CT is scheduled for 09/01/2021 at 2:30 pm (arrive by 2:15 pm) @ [facility] [address]. Nothing to eat or drink 4 hours prior. Please pick up contrast between now and the day before CT.   If you have any concerns or questions, please feel free to call our office. See follow up appointment below.   Inguinal Hernia, Adult An inguinal hernia is when fat or your intestines push through a weak spot in a muscle where your leg meets your lower belly (groin). This causes a bulge. This kind of hernia could also be: In your scrotum, if you are male. In folds of skin around your vagina, if you are male. There are three types of inguinal hernias: Hernias that can be pushed back into the belly (are reducible). This type rarely causes pain. Hernias that cannot be pushed back into the belly (are incarcerated). Hernias that cannot be pushed back into the belly and lose their blood supply (are strangulated). This type needs emergency surgery. What are the causes? This condition is caused by having a weak spot in the muscles or tissues in your groin. This develops over time. The hernia may poke through the weak spot when you strain your lower belly muscles all of a sudden, such as when you: Lift a heavy object. Strain to poop (have a bowel movement). Trouble pooping (constipation) can lead to straining. Cough. What increases the risk? This condition is more likely to develop in: Males. Pregnant females. People who: Are overweight. Work in jobs that require long periods of standing or heavy lifting. Have had an inguinal hernia before. Smoke or have lung disease. These factors can lead to long-term (chronic) coughing. What are the signs or symptoms? Symptoms may depend on the size of the hernia. Often, a small hernia has no symptoms. Symptoms of a larger hernia may include: A bulge in the groin area. This is easier to see when standing. You might not be able to see it when you are lying  down. Pain or burning in the groin. This may get worse when you lift, strain, or cough. A dull ache or a feeling of pressure in the groin. An abnormal bulge in the scrotum, in males. Symptoms of a strangulated inguinal hernia may include: A bulge in your groin that is very painful and tender to the touch. A bulge that turns red or purple. Fever, feeling like you may vomit (nausea), and vomiting. Not being able to poop or to pass gas. How is this treated? Treatment depends on the size of your hernia and whether you have symptoms. If you do not have symptoms, your doctor may have you watch your hernia carefully and have you come in for follow-up visits. If your hernia is large or if you have symptoms, you may need surgery to repair the hernia. Follow these instructions at home: Lifestyle Avoid lifting heavy objects. Avoid standing for long amounts of time. Do not smoke or use any products that contain nicotine or tobacco. If you need help quitting, ask your doctor. Stay at a healthy weight. Prevent trouble pooping You may need to take these actions to prevent or treat trouble pooping: Drink enough fluid to keep your pee (urine) pale yellow. Take over-the-counter or prescription medicines. Eat foods that are high in fiber. These include beans, whole grains, and fresh fruits and vegetables. Limit foods that are high in fat and sugar. These include fried or sweet foods. General instructions You may try to push your hernia  back in place by very gently pressing on it when you are lying down. Do not try to push the bulge back in if it will not go in easily. Watch your hernia for any changes in shape, size, or color. Tell your doctor if you see any changes. Take over-the-counter and prescription medicines only as told by your doctor. Keep all follow-up visits. Contact a doctor if: You have a fever or chills. You have new symptoms. Your symptoms get worse. Get help right away if: You have pain  in your groin that gets worse all of a sudden. You have a bulge in your groin that: Gets bigger all of a sudden, and it does not get smaller after that. Turns red or purple. Is painful when you touch it. You are a male, and you have: Sudden pain in your scrotum. A sudden change in the size of your scrotum. You cannot push the hernia back in place by very gently pressing on it when you are lying down. You feel like you may vomit, and that feeling does not go away. You keep vomiting. You have a fast heartbeat. You cannot poop or pass gas. These symptoms may be an emergency. Get help right away. Call your local emergency services (911 in the [place] Do not wait to see if the symptoms will go away. Do not drive yourself to the hospital. Summary An inguinal hernia is when fat or your intestines push through a weak spot in a muscle where your leg meets your lower belly (groin). This causes a bulge. If you do not have symptoms, you may not need treatment. If you have symptoms or a large hernia, you may need surgery. Avoid lifting heavy objects. Also, avoid standing for long amounts of time. Do not try to push the bulge back in if it will not go in easily. This information is not intended to replace advice given to you by your health care provider. Make sure you discuss any questions you have with your health care provider. Document Revised: 11/24/2019 Document Reviewed: 11/24/2019 Elsevier Patient Education  2023 [employer].
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
## 2021-09-01 ENCOUNTER — Ambulatory Visit: Admission: RE | Admit: 2021-09-01 | Payer: Medicaid Other | Source: Ambulatory Visit

## 2021-09-05 ENCOUNTER — Ambulatory Visit
Admission: RE | Admit: 2021-09-05 | Discharge: 2021-09-05 | Disposition: A | Discharge status: Home / Self Care | Payer: Medicaid Other | Source: Ambulatory Visit | Attending: Surgery | Admitting: Surgery

## 2021-09-05 ENCOUNTER — Other Ambulatory Visit: Payer: Self-pay | Admitting: Surgery

## 2021-09-05 ENCOUNTER — Telehealth: Payer: Self-pay

## 2021-09-05 DIAGNOSIS — R1032 Left lower quadrant pain: Secondary | ICD-10-CM | POA: Diagnosis not present

## 2021-09-05 NOTE — Telephone Encounter (Signed)
CT Contrast made patient sick-CT scan scheduled for Friday and when he arrived to have it they told him it had been cancelled- due to the Prior authorization- can patient have the CT without contrast-

## 2021-09-07 ENCOUNTER — Encounter: Payer: Self-pay | Admitting: Surgery

## 2021-09-07 ENCOUNTER — Encounter: Payer: Self-pay | Admitting: Nurse Practitioner

## 2021-09-07 ENCOUNTER — Ambulatory Visit (INDEPENDENT_AMBULATORY_CARE_PROVIDER_SITE_OTHER): Payer: Medicaid Other | Admitting: Nurse Practitioner

## 2021-09-07 ENCOUNTER — Ambulatory Visit (INDEPENDENT_AMBULATORY_CARE_PROVIDER_SITE_OTHER): Payer: Medicaid Other | Admitting: Surgery

## 2021-09-07 ENCOUNTER — Other Ambulatory Visit: Payer: Self-pay

## 2021-09-07 VITALS — BP 125/79 | HR 77 | Temp 98.0°F | Ht 71.0 in | Wt 166.0 lb

## 2021-09-07 VITALS — BP 120/72 | HR 81 | Temp 97.8°F | Resp 18 | Ht 71.0 in | Wt 167.8 lb

## 2021-09-07 DIAGNOSIS — F5105 Insomnia due to other mental disorder: Secondary | ICD-10-CM

## 2021-09-07 DIAGNOSIS — F329 Major depressive disorder, single episode, unspecified: Secondary | ICD-10-CM

## 2021-09-07 DIAGNOSIS — F99 Mental disorder, not otherwise specified: Secondary | ICD-10-CM | POA: Diagnosis not present

## 2021-09-07 DIAGNOSIS — R1032 Left lower quadrant pain: Secondary | ICD-10-CM | POA: Diagnosis not present

## 2021-09-07 DIAGNOSIS — Z419 Encounter for procedure for purposes other than remedying health state, unspecified: Secondary | ICD-10-CM | POA: Diagnosis not present

## 2021-09-07 DIAGNOSIS — F419 Anxiety disorder, unspecified: Secondary | ICD-10-CM

## 2021-09-07 MED ORDER — DULOXETINE HCL 20 MG PO CPEP: 20.0000 mg | ORAL_CAPSULE | Freq: Every day | ORAL | 0 refills | Status: DC

## 2021-09-07 MED ORDER — DULOXETINE HCL 60 MG PO CPEP: 60.0000 mg | ORAL_CAPSULE | Freq: Every day | ORAL | 0 refills | Status: DC

## 2021-09-07 MED ORDER — TRAZODONE HCL 50 MG PO TABS: 25.0000 mg | ORAL_TABLET | Freq: Every evening | ORAL | 3 refills | Status: DC | PRN

## 2021-09-07 NOTE — Assessment & Plan Note (Signed)
She reports that his anxiety has been getting better continue taking Cymbalta 80 mg daily and BuSpar as needed.

## 2021-09-07 NOTE — Progress Notes (Signed)
Surgical Clinic Progress/Follow-up Note   HPI:  21 y.o. Male presents to clinic for groin pain follow-up  Patient reports persistence of discomfort when he lifts things at work/[employer] to in excess.  Denies N/V, fever/chills, CP, or SOB.  Did not tolerate drinking the contrast for CT scan however did and then found out his CT scan was not approved by insurance.  Very disappointing.  However an ultrasound was obtained as compromised to ensure that there was no recurrence of his hernia.  This indeed confirmed that there was no evidence of recurrent inguinal hernia.  We discussed lifting techniques, avoiding excesses on the job, and utilizing carts and other devices to assist with lifting to avoid exacerbation.  We also are readily available to see him again in a few months if this persists despite anticipated resolution of groin strain.  Review of Systems:  Constitutional: denies fever/chills  ENT: denies sore throat, hearing problems  Respiratory: denies shortness of breath, wheezing  Cardiovascular: denies chest pain, palpitations  Gastrointestinal: denies abdominal pain, N/V, or diarrhea/and bowel function as per interval history Skin: Denies any other rashes or skin discolorations as per interval history  Vital Signs:  BP 125/79   Pulse 77   Temp 98 F (36.7 C)   Ht 5\' 11"  (1.803 m)   Wt 166 lb (75.3 kg)   SpO2 98%   BMI 23.15 kg/m    Physical Exam:  Constitutional:  -- Normal body habitus  -- Awake, alert, and oriented x3  Pulmonary:  -- Breathing non-labored at rest Cardiovascular:  -- Regular rate and rhythm Gastrointestinal:  -- Soft and non-distended, non-tender Musculoskeletal / Integumentary:  -- Wounds or skin discoloration: None appreciated -- Extremities: B/L UE and LE FROM, hands and feet warm, no edema    Imaging: CLINICAL DATA:  Left inguinal pain   EXAM: LEFT LOWER EXTREMITY SOFT TISSUE ULTRASOUND LIMITED   TECHNIQUE: Ultrasound examination was  performed including evaluation of the muscles, tendons, joint, and adjacent soft tissues.   COMPARISON:  None Available.   FINDINGS: Limited soft tissue ultrasound of the area of concern involving the left groin demonstrates no evidence of soft tissue mass or fluid collection. No evidence of inguinal hernia with Valsalva maneuver.   IMPRESSION: No soft tissue abnormality seen in the left groin, including no evidence of inguinal hernia.     Electronically Signed   By: M.D.   On: 09/05/2021 13:24  Assessment:  21 y.o. yo Male with a problem list including...  Patient Active Problem List   Diagnosis Date Noted   Left inguinal pain 08/24/2021   Major depressive disorder with current active episode 08/18/2021   Anxiety 08/18/2021   Insomnia due to other mental disorder 08/18/2021   Closed supracondylar fracture of right humerus 01/19/2021    presents to clinic for follow-up evaluation, no evidence of recurrent hernia, groin strain noted with relatively physical job.  Plan:              - return to clinic 2 to 3 months or as needed, instructed to call office if any questions or concerns  All of the above recommendations were discussed with the patient, and all of patient's questions were answered to his expressed satisfaction.  These notes generated with voice recognition software. I apologize for typographical errors.  01/21/2021, MD, FACS [place]: [facility] - Partnering for exceptional care. Office: [phone]

## 2021-09-07 NOTE — Progress Notes (Signed)
BP 120/72   Pulse 81   Temp 97.8 F (36.6 C) (Oral)   Resp 18   Ht 5\' 11"  (1.803 m)   Wt 167 lb 12.8 oz (76.1 kg)   SpO2 98%   BMI 23.40 kg/m    Subjective:    Patient ID: Cesar Peterson, male    DOB: 04-11-2000, 21 y.o.   MRN: [id number]  HPI: Cesar Peterson is a 21 y.o. male  Chief Complaint  Patient presents with   Follow-up    4 week recheck depression   Depression/anxiety/insomnia: Patient reports that his depression anxiety has gotten better.  He says now that he is working he is doing well while he is at work.  He says his biggest problem has been not being able to sleep.  He says he has tried melatonin and still having trouble falling asleep.  He also tried taking hydroxyzine but states that did not really help him fall asleep.  We will trial trazodone at night to see if that helps.  Patient PHQ-9 still positive but improved from last visit.  GAD score still positive but improved from last visit.  Continue taking Cymbalta 80 mg daily and BuSpar as needed.    09/07/2021    9:00 AM 08/18/2021    8:32 AM 07/06/2021    1:11 PM 05/18/2021   10:25 AM 04/20/2021    8:30 AM  Depression screen PHQ 2/9  Decreased Interest 1 3 3 2  0  Down, Depressed, Hopeless 2 3 3 2 3   PHQ - 2 Score 3 6 6 4 3   Altered sleeping 3 3 3 3 3   Tired, decreased energy 2 2 2 2 2   Change in appetite 3 2 2 3 3   Feeling bad or failure about yourself  2 1 2 3 3   Trouble concentrating 0 0 0 2 0  Moving slowly or fidgety/restless 0 1 0 0 0  Suicidal thoughts 0  0 2 1  PHQ-9 Score 13 15 15 19 15   Difficult doing work/chores Somewhat difficult Very difficult Somewhat difficult Very difficult Somewhat difficult       09/07/2021    9:01 AM 08/18/2021    8:32 AM 07/06/2021    1:12 PM 05/18/2021   10:26 AM  GAD 7 : Generalized Anxiety Score  Nervous, Anxious, on Edge 1 1 2 2   Control/stop worrying 2 2 3 2   Worry too much - different things 2 3 3 2   Trouble relaxing 0 1 1 1   Restless 0 1 0 1  Easily annoyed  or irritable 1 1 2 1   Afraid - awful might happen 0 1 2 2   Total GAD 7 Score 6 10 13 11   Anxiety Difficulty Somewhat difficult Somewhat difficult Somewhat difficult Very difficult     Relevant past medical, surgical, family and social history reviewed and updated as indicated. Interim medical history since our last visit reviewed. Allergies and medications reviewed and updated.  Review of Systems  Constitutional: Negative for fever or weight change.  Respiratory: Negative for cough and shortness of breath.   Cardiovascular: Negative for chest pain or palpitations.  Gastrointestinal: Negative for abdominal pain, no bowel changes.  Musculoskeletal: Negative for gait problem or joint swelling.  Skin: Negative for rash.  Neurological: Negative for dizziness or headache.  No other specific complaints in a complete review of systems (except as listed in HPI above).      Objective:    BP 120/72   Pulse 81  Temp 97.8 F (36.6 C) (Oral)   Resp 18   Ht 5\' 11"  (1.803 m)   Wt 167 lb 12.8 oz (76.1 kg)   SpO2 98%   BMI 23.40 kg/m   Wt Readings from Last 3 Encounters:  09/07/21 167 lb 12.8 oz (76.1 kg) (68 %, Z= 0.47)*  09/07/21 166 lb (75.3 kg) (66 %, Z= 0.41)*  08/24/21 167 lb 3.2 oz (75.8 kg) (67 %, Z= 0.45)*   * Growth percentiles are based on CDC (Boys, 2-20 Years) data.    Physical Exam  Constitutional: Patient appears well-developed and well-nourished. No distress.  HEENT: head atraumatic, normocephalic, pupils equal and reactive to light,  neck supple Cardiovascular: Normal rate, regular rhythm and normal heart sounds.  No murmur heard. No BLE edema. Pulmonary/Chest: Effort normal and breath sounds normal. No respiratory distress. Abdominal: Soft.  There is no tenderness. Psychiatric: Patient has a normal mood and affect. behavior is normal. Judgment and thought content normal.     Assessment & Plan:   Problem List Items Addressed This Visit       Other   Major  depressive disorder with current active episode    Patient reports depression is slowly getting better.  Continue taking Cymbalta 80 mg daily.       Relevant Medications   traZODone (DESYREL) 50 MG tablet   DULoxetine (CYMBALTA) 60 MG capsule   DULoxetine (CYMBALTA) 20 MG capsule   Anxiety    She reports that his anxiety has been getting better continue taking Cymbalta 80 mg daily and BuSpar as needed.       Relevant Medications   traZODone (DESYREL) 50 MG tablet   DULoxetine (CYMBALTA) 60 MG capsule   DULoxetine (CYMBALTA) 20 MG capsule   Insomnia due to other mental disorder - Primary    Patient reports insomnia has been getting worse.  Hydroxyzine and melatonin did not work.  We will trial trazodone at bedtime.       Relevant Medications   traZODone (DESYREL) 50 MG tablet     Follow up plan: Return in about 4 weeks (around 10/05/2021) for follow up.

## 2021-09-07 NOTE — Assessment & Plan Note (Signed)
Patient reports depression is slowly getting better.  Continue taking Cymbalta 80 mg daily.

## 2021-09-07 NOTE — Assessment & Plan Note (Signed)
Patient reports insomnia has been getting worse.  Hydroxyzine and melatonin did not work.  We will trial trazodone at bedtime.

## 2021-09-07 NOTE — Patient Instructions (Signed)
Please call the office if you have any questions or concerns. 

## 2021-09-08 ENCOUNTER — Ambulatory Visit: Payer: Medicaid Other | Admitting: Nurse Practitioner

## 2021-09-25 ENCOUNTER — Encounter: Payer: Self-pay | Admitting: Family Medicine

## 2021-09-25 ENCOUNTER — Telehealth (INDEPENDENT_AMBULATORY_CARE_PROVIDER_SITE_OTHER): Payer: Medicaid Other | Admitting: Family Medicine

## 2021-09-25 ENCOUNTER — Other Ambulatory Visit: Payer: Self-pay

## 2021-09-25 VITALS — Temp 99.5°F

## 2021-09-25 DIAGNOSIS — J069 Acute upper respiratory infection, unspecified: Secondary | ICD-10-CM | POA: Diagnosis not present

## 2021-09-25 NOTE — Progress Notes (Signed)
Virtual Visit via Telephone Note  I connected with Cesar Peterson on 09/25/21 at  8:40 AM EDT by telephone and verified that I am speaking with the correct person using two identifiers.  Location: Patient: home Provider: [facility]   I discussed the limitations, risks, security and privacy concerns of performing an evaluation and management service by telephone and the availability of in person appointments. I also discussed with the patient that there may be a patient responsible charge related to this service. The patient expressed understanding and agreed to proceed.   History of Present Illness:  UPPER RESPIRATORY TRACT INFECTION - symptom onset 09/21/21 - mom with COVID - tested negative this morning  Fever:  low grade Cough: yes, productive of mucus Shortness of breath: no Chest pain: no Chest tightness:  maybe Chest congestion: yes Nasal congestion: yes Runny nose: yes Sneezing: yes Sore throat: yes Sinus pressure: yes Headache: yes Vomiting: no Sick contacts: yes, mom with COVID Relief with OTC cold/cough medications: yes  Treatments attempted: cold/sinus    Observations/Objective:  Entirety of visit conducted over the phone.  Speaks in full sentences, no respiratory distress.   Assessment and Plan:  VIRAL URI Mild-mod sx. With known +COVID close contact and only tested once, will retest to ensure true negative. Would be a candidate for COVID treatment if positive. Reviewed OTC symptom relief, self-quarantine guidelines, and emergency precautions. Work note provided.      I discussed the assessment and treatment plan with the patient. The patient was provided an opportunity to ask questions and all were answered. The patient agreed with the plan and demonstrated an understanding of the instructions.   The patient was advised to call back or seek an in-person evaluation if the symptoms worsen or if the condition fails to improve as anticipated.  I provided 14  minutes of non-face-to-face time during this encounter.   Caro Laroche, DO

## 2021-10-02 ENCOUNTER — Other Ambulatory Visit: Payer: Self-pay

## 2021-10-02 DIAGNOSIS — F329 Major depressive disorder, single episode, unspecified: Secondary | ICD-10-CM

## 2021-10-02 MED ORDER — DULOXETINE HCL 60 MG PO CPEP: 60.0000 mg | ORAL_CAPSULE | Freq: Every day | ORAL | 0 refills | Status: DC

## 2021-10-07 DIAGNOSIS — Z419 Encounter for procedure for purposes other than remedying health state, unspecified: Secondary | ICD-10-CM | POA: Diagnosis not present

## 2021-11-07 DIAGNOSIS — Z419 Encounter for procedure for purposes other than remedying health state, unspecified: Secondary | ICD-10-CM | POA: Diagnosis not present

## 2021-12-08 ENCOUNTER — Ambulatory Visit (INDEPENDENT_AMBULATORY_CARE_PROVIDER_SITE_OTHER): Payer: Medicaid Other | Admitting: Nurse Practitioner

## 2021-12-08 ENCOUNTER — Other Ambulatory Visit: Payer: Self-pay

## 2021-12-08 ENCOUNTER — Encounter: Payer: Self-pay | Admitting: Nurse Practitioner

## 2021-12-08 VITALS — BP 122/70 | HR 91 | Temp 97.7°F | Resp 18 | Ht 71.0 in | Wt 167.3 lb

## 2021-12-08 DIAGNOSIS — F5105 Insomnia due to other mental disorder: Secondary | ICD-10-CM | POA: Diagnosis not present

## 2021-12-08 DIAGNOSIS — F419 Anxiety disorder, unspecified: Secondary | ICD-10-CM | POA: Diagnosis not present

## 2021-12-08 DIAGNOSIS — F99 Mental disorder, not otherwise specified: Secondary | ICD-10-CM

## 2021-12-08 DIAGNOSIS — F329 Major depressive disorder, single episode, unspecified: Secondary | ICD-10-CM | POA: Diagnosis not present

## 2021-12-08 DIAGNOSIS — Z419 Encounter for procedure for purposes other than remedying health state, unspecified: Secondary | ICD-10-CM | POA: Diagnosis not present

## 2021-12-08 MED ORDER — DULOXETINE HCL 60 MG PO CPEP: 60.0000 mg | ORAL_CAPSULE | Freq: Every day | ORAL | 0 refills | Status: DC

## 2021-12-08 MED ORDER — ARIPIPRAZOLE 2 MG PO TABS: 2.0000 mg | ORAL_TABLET | Freq: Every day | ORAL | 0 refills | Status: DC

## 2021-12-08 NOTE — Progress Notes (Signed)
BP 122/70   Pulse 91   Temp 97.7 F (36.5 C) (Oral)   Resp 18   Ht 5\' 11"  (1.803 m)   Wt 167 lb 4.8 oz (75.9 kg)   SpO2 99%   BMI 23.33 kg/m    Subjective:    Patient ID: , male    DOB: 2000-11-25, 21 y.o.   MRN: [birth year]/2002  HPI: Cesar Peterson is a 21 y.o. male  Chief Complaint  Patient presents with   Depression   Anxiety    3 month follow up   Depression/anxiety/insomnia: Patient has been taking Cymbalta 80 mg daily, buspar 5 mg three times a day as needed and trazodone 50 mg a night for insomnia.  She reports that his depression anxiety seems to be getting worse.  Patient also states he is not really sleeping at night with the trazodone.  Pt PHQ9 and GAD scores are positive.  He says he is looking for a job and that has been stressful.  States he is also having a lot of stress at home.  Discussed increasing trazodone to 75 mg at night.  She is to continue using BuSpar as needed and Cymbalta 80 mg daily.  We will also add on Abilify 2 mg daily.Patient will follow-up in 4 weeks.       12/08/2021    8:25 AM 09/25/2021    8:15 AM 09/07/2021    9:00 AM 08/18/2021    8:32 AM 07/06/2021    1:11 PM  Depression screen PHQ 2/9  Decreased Interest 3 0 1 3 3   Down, Depressed, Hopeless 3 0 2 3 3   PHQ - 2 Score 6 0 3 6 6   Altered sleeping 3  3 3 3   Tired, decreased energy 2  2 2 2   Change in appetite 3  3 2 2   Feeling bad or failure about yourself  3  2 1 2   Trouble concentrating 0  0 0 0  Moving slowly or fidgety/restless 0  0 1 0  Suicidal thoughts 1  0  0  PHQ-9 Score 18  13 15 15   Difficult doing work/chores Very difficult  Somewhat difficult Very difficult Somewhat difficult       12/08/2021    8:25 AM 09/07/2021    9:01 AM 08/18/2021    8:32 AM 07/06/2021    1:12 PM  GAD 7 : Generalized Anxiety Score  Nervous, Anxious, on Edge 2 1 1 2   Control/stop worrying 3 2 2 3   Worry too much - different things 3 2 3 3   Trouble relaxing 3 0 1 1  Restless 0 0 1 0   Easily annoyed or irritable 3 1 1 2   Afraid - awful might happen 2 0 1 2  Total GAD 7 Score 16 6 10 13   Anxiety Difficulty Very difficult Somewhat difficult Somewhat difficult Somewhat difficult     Relevant past medical, surgical, family and social history reviewed and updated as indicated. Interim medical history since our last visit reviewed. Allergies and medications reviewed and updated.  Review of Systems  Constitutional: Negative for fever or weight change.  Respiratory: Negative for cough and shortness of breath.   Cardiovascular: Negative for chest pain or palpitations.  Gastrointestinal: Negative for abdominal pain, no bowel changes.  Musculoskeletal: Negative for gait problem or joint swelling.  Skin: Negative for rash.  Neurological: Negative for dizziness or headache.  No other specific complaints in a complete review of systems (except as listed  in HPI above).      Objective:    BP 122/70   Pulse 91   Temp 97.7 F (36.5 C) (Oral)   Resp 18   Ht 5\' 11"  (1.803 m)   Wt 167 lb 4.8 oz (75.9 kg)   SpO2 99%   BMI 23.33 kg/m   Wt Readings from Last 3 Encounters:  12/08/21 167 lb 4.8 oz (75.9 kg)  09/07/21 167 lb 12.8 oz (76.1 kg) (68 %, Z= 0.47)*  09/07/21 166 lb (75.3 kg) (66 %, Z= 0.41)*   * Growth percentiles are based on CDC (Boys, 2-20 Years) data.    Physical Exam  Constitutional: Patient appears well-developed and well-nourished. No distress.  HEENT: head atraumatic, normocephalic, pupils equal and reactive to light,  neck supple Cardiovascular: Normal rate, regular rhythm and normal heart sounds.  No murmur heard. No BLE edema. Pulmonary/Chest: Effort normal and breath sounds normal. No respiratory distress. Abdominal: Soft.  There is no tenderness. Psychiatric: Patient has a normal mood and affect. behavior is normal. Judgment and thought content normal.     Assessment & Plan:   Problem List Items Addressed This Visit       Other   Major  depressive disorder with current active episode    Continue taking Cymbalta 80 mg daily add on Abilify 2 mg daily continue taking BuSpar 5 mg 3 times a day as needed.      Relevant Medications   DULoxetine (CYMBALTA) 60 MG capsule   ARIPiprazole (ABILIFY) 2 MG tablet   Anxiety - Primary    Continue taking Cymbalta 80 mg daily add on Abilify 2 mg daily continue taking BuSpar 5 mg 3 times a day as needed.      Relevant Medications   DULoxetine (CYMBALTA) 60 MG capsule   ARIPiprazole (ABILIFY) 2 MG tablet   Insomnia due to other mental disorder    Increase trazodone to 50 to 100 mg at night.        Follow up plan: Return in about 4 weeks (around 01/05/2022) for follow up.

## 2021-12-08 NOTE — Assessment & Plan Note (Signed)
Increase trazodone to 50 to 100 mg at night.

## 2021-12-08 NOTE — Assessment & Plan Note (Signed)
Continue taking Cymbalta 80 mg daily add on Abilify 2 mg daily continue taking BuSpar 5 mg 3 times a day as needed.

## 2021-12-08 NOTE — Assessment & Plan Note (Signed)
Continue taking Cymbalta 80 mg daily add on Abilify 2 mg daily continue taking BuSpar 5 mg 3 times a day as needed. 

## 2021-12-28 ENCOUNTER — Telehealth: Payer: Self-pay

## 2021-12-28 DIAGNOSIS — F419 Anxiety disorder, unspecified: Secondary | ICD-10-CM

## 2021-12-28 DIAGNOSIS — F329 Major depressive disorder, single episode, unspecified: Secondary | ICD-10-CM

## 2021-12-28 NOTE — Telephone Encounter (Signed)
Pt is calling to report that he is out his depression medication for 2 days. And he is not doing well without it. Pt has appt tomorrow. Please advise

## 2021-12-29 ENCOUNTER — Ambulatory Visit (INDEPENDENT_AMBULATORY_CARE_PROVIDER_SITE_OTHER): Payer: Medicaid Other | Admitting: Nurse Practitioner

## 2021-12-29 ENCOUNTER — Other Ambulatory Visit: Payer: Self-pay

## 2021-12-29 VITALS — Resp 18

## 2021-12-29 DIAGNOSIS — F329 Major depressive disorder, single episode, unspecified: Secondary | ICD-10-CM

## 2021-12-29 DIAGNOSIS — F5105 Insomnia due to other mental disorder: Secondary | ICD-10-CM | POA: Diagnosis not present

## 2021-12-29 DIAGNOSIS — F419 Anxiety disorder, unspecified: Secondary | ICD-10-CM | POA: Diagnosis not present

## 2021-12-29 DIAGNOSIS — F99 Mental disorder, not otherwise specified: Secondary | ICD-10-CM | POA: Diagnosis not present

## 2021-12-29 MED ORDER — DULOXETINE HCL 20 MG PO CPEP: 20.0000 mg | ORAL_CAPSULE | Freq: Every day | ORAL | 0 refills | Status: DC

## 2021-12-29 MED ORDER — DULOXETINE HCL 60 MG PO CPEP: 60.0000 mg | ORAL_CAPSULE | Freq: Every day | ORAL | 0 refills | Status: DC

## 2021-12-29 MED ORDER — BUSPIRONE HCL 5 MG PO TABS: 5.0000 mg | ORAL_TABLET | Freq: Three times a day (TID) | ORAL | 1 refills | Status: DC | PRN

## 2021-12-29 MED ORDER — BELSOMRA 5 MG PO TABS: 5.0000 mg | ORAL_TABLET | Freq: Every evening | ORAL | 0 refills | Status: DC | PRN

## 2021-12-29 MED ORDER — ARIPIPRAZOLE 5 MG PO TABS: 5.0000 mg | ORAL_TABLET | Freq: Every day | ORAL | 0 refills | Status: DC

## 2021-12-29 NOTE — Assessment & Plan Note (Signed)
Continue taking Cymbalta 80 mg daily, Abilify increased to 5 mg daily, stop taking trazodone and start taking Belsomra 5 mg at bedtime for sleep.  Continue taking BuSpar 5 mg 3 times daily as needed. 

## 2021-12-29 NOTE — Telephone Encounter (Signed)
Will refill at appointment

## 2021-12-29 NOTE — Progress Notes (Signed)
Name: Cesar Peterson   MRN: [id number]    DOB: [birth year]   Date:12/29/2021       Progress Note  Subjective  Chief Complaint  Chief Complaint  Patient presents with   Anxiety   Depression   Insomnia    I connected with  Luberta Robertson  on 12/29/21 at 10:00 AM EDT by a telephone enabled telemedicine application and verified that I am speaking with the correct person using two identifiers.  I discussed the limitations of evaluation and management by telemedicine and the availability of in person appointments. The patient expressed understanding and agreed to proceed with a virtual visit  Staff also discussed with the patient that there may be a patient responsible charge related to this service. Patient Location: home Provider Location: [facility] Additional Individuals present: alone  HPI  Depression/anxiety/insomnia: On 12/08/2021 patient's PHQ-9 and GAD scores were elevated again.  Patient had been taking Cymbalta 80 mg daily, BuSpar 5 mg 3 times a day as needed and trazodone 50 mg at night for insomnia.  Patient had reported that he felt like his depression anxiety were getting worse.  Patient also stated that the trazodone was not really helping him sleep at night.  At that appointment we discussed increasing his trazodone to 75 mg at night.  To continue taking the BuSpar as needed and Cymbalta 80 mg daily and we added on Abilify 2 mg daily. Patient reports he has been out of his medication for 2 days.  Patient PHQ9 and GAD scores are still elevated.  Patient reports he still has not been able to sleep with increasing the trazodone.  We will discontinue trazodone and start Belsomra 5 mg at night.  Patient also reports that he has not really noticed a difference with the Abilify.  We will increase Abilify dose to 5 mg daily.  Refill sent of Cymbalta 60 mg and 20 mg.     12/29/2021    9:45 AM 12/08/2021    8:25 AM 09/25/2021    8:15 AM 09/07/2021    9:00 AM 08/18/2021    8:32 AM  Depression  screen PHQ 2/9  Decreased Interest 3 3 0 1 3  Down, Depressed, Hopeless 3 3 0 2 3  PHQ - 2 Score 6 6 0 3 6  Altered sleeping 3 3  3 3   Tired, decreased energy 2 2  2 2   Change in appetite 3 3  3 2   Feeling bad or failure about yourself  3 3  2 1   Trouble concentrating 0 0  0 0  Moving slowly or fidgety/restless 0 0  0 1  Suicidal thoughts 1 1  0   PHQ-9 Score 18 18  13 15   Difficult doing work/chores Very difficult Very difficult  Somewhat difficult Very difficult       12/29/2021    9:46 AM 12/08/2021    8:25 AM 09/07/2021    9:01 AM 08/18/2021    8:32 AM  GAD 7 : Generalized Anxiety Score  Nervous, Anxious, on Edge 3 2 1 1   Control/stop worrying 3 3 2 2   Worry too much - different things 3 3 2 3   Trouble relaxing 0 3 0 1  Restless 0 0 0 1  Easily annoyed or irritable 3 3 1 1   Afraid - awful might happen 3 2 0 1  Total GAD 7 Score 15 16 6 10   Anxiety Difficulty Somewhat difficult Very difficult Somewhat difficult Somewhat difficult  Patient Active Problem List   Diagnosis Date Noted   Left inguinal pain 08/24/2021   Major depressive disorder with current active episode 08/18/2021   Anxiety 08/18/2021   Insomnia due to other mental disorder 08/18/2021   Closed supracondylar fracture of right humerus 01/19/2021    Social History   Tobacco Use   Smoking status: Never    Passive exposure: Never   Smokeless tobacco: Never  Substance Use Topics   Alcohol use: Never     Current Outpatient Medications:    ARIPiprazole (ABILIFY) 5 MG tablet, Take 1 tablet (5 mg total) by mouth daily., Disp: 30 tablet, Rfl: 0   DULoxetine (CYMBALTA) 20 MG capsule, Take 1 capsule (20 mg total) by mouth daily. Take with Cymbalta 60 mg for a total of 80 mg daily, Disp: 90 capsule, Rfl: 0   Suvorexant (BELSOMRA) 5 MG TABS, Take 5 mg by mouth at bedtime as needed., Disp: 30 tablet, Rfl: 0   busPIRone (BUSPAR) 5 MG tablet, Take 1 tablet (5 mg total) by mouth 3 (three) times daily as needed.,  Disp: 90 tablet, Rfl: 1   DULoxetine (CYMBALTA) 60 MG capsule, Take 1 capsule (60 mg total) by mouth daily. Take with Cymbalta 20 mg for a total of 80 mg daily., Disp: 90 capsule, Rfl: 0  No Known Allergies  I personally reviewed active problem list, medication list, allergies, notes from last encounter with the patient/caregiver today.  ROS  Constitutional: Negative for fever or weight change.  Respiratory: Negative for cough and shortness of breath.   Cardiovascular: Negative for chest pain or palpitations.  Gastrointestinal: Negative for abdominal pain, no bowel changes.  Musculoskeletal: Negative for gait problem or joint swelling.  Skin: Negative for rash.  Neurological: Negative for dizziness or headache.  No other specific complaints in a complete review of systems (except as listed in HPI above).   Objective  Virtual encounter, vitals not obtained.  There is no height or weight on file to calculate BMI.  Nursing Note and Vital Signs reviewed.  Physical Exam  Awake, alert, oriented x3 speaking in complete sentences  No results found for this or any previous visit (from the past 72 hour(s)).  Assessment & Plan  Problem List Items Addressed This Visit       Other   Major depressive disorder with current active episode - Primary    Continue taking Cymbalta 80 mg daily, Abilify increased to 5 mg daily, stop taking trazodone and start taking Belsomra 5 mg at bedtime for sleep.  Continue taking BuSpar 5 mg 3 times daily as needed.      Relevant Medications   DULoxetine (CYMBALTA) 20 MG capsule   DULoxetine (CYMBALTA) 60 MG capsule   ARIPiprazole (ABILIFY) 5 MG tablet   busPIRone (BUSPAR) 5 MG tablet   Anxiety    Continue taking Cymbalta 80 mg daily, Abilify increased to 5 mg daily, stop taking trazodone and start taking Belsomra 5 mg at bedtime for sleep.  Continue taking BuSpar 5 mg 3 times daily as needed.      Relevant Medications   DULoxetine (CYMBALTA) 20 MG  capsule   DULoxetine (CYMBALTA) 60 MG capsule   ARIPiprazole (ABILIFY) 5 MG tablet   busPIRone (BUSPAR) 5 MG tablet   Insomnia due to other mental disorder    Continue taking Cymbalta 80 mg daily, Abilify increased to 5 mg daily, stop taking trazodone and start taking Belsomra 5 mg at bedtime for sleep.  Continue taking BuSpar 5 mg 3  times daily as needed.      Relevant Medications   Suvorexant (BELSOMRA) 5 MG TABS    Follow-up in 4 weeks  -Red flags and when to present for emergency care or RTC including fever >101.21F, chest pain, shortness of breath, new/worsening/un-resolving symptoms,  reviewed with patient at time of visit. Follow up and care instructions discussed and provided in AVS. - I discussed the assessment and treatment plan with the patient. The patient was provided an opportunity to ask questions and all were answered. The patient agreed with the plan and demonstrated an understanding of the instructions.  I provided 20 minutes of non-face-to-face time during this encounter.  Berniece Salines, FNP

## 2021-12-29 NOTE — Assessment & Plan Note (Signed)
Continue taking Cymbalta 80 mg daily, Abilify increased to 5 mg daily, stop taking trazodone and start taking Belsomra 5 mg at bedtime for sleep.  Continue taking BuSpar 5 mg 3 times daily as needed.

## 2022-01-05 ENCOUNTER — Ambulatory Visit: Payer: Medicaid Other | Admitting: Nurse Practitioner

## 2022-01-07 DIAGNOSIS — Z419 Encounter for procedure for purposes other than remedying health state, unspecified: Secondary | ICD-10-CM | POA: Diagnosis not present

## 2022-02-07 DIAGNOSIS — Z419 Encounter for procedure for purposes other than remedying health state, unspecified: Secondary | ICD-10-CM | POA: Diagnosis not present

## 2022-03-09 DIAGNOSIS — Z419 Encounter for procedure for purposes other than remedying health state, unspecified: Secondary | ICD-10-CM | POA: Diagnosis not present

## 2022-04-09 DIAGNOSIS — Z419 Encounter for procedure for purposes other than remedying health state, unspecified: Secondary | ICD-10-CM | POA: Diagnosis not present

## 2022-05-07 ENCOUNTER — Encounter: Payer: Self-pay | Admitting: Nurse Practitioner

## 2022-05-07 ENCOUNTER — Telehealth (INDEPENDENT_AMBULATORY_CARE_PROVIDER_SITE_OTHER): Payer: Medicaid Other | Admitting: Nurse Practitioner

## 2022-05-07 DIAGNOSIS — F419 Anxiety disorder, unspecified: Secondary | ICD-10-CM | POA: Diagnosis not present

## 2022-05-07 DIAGNOSIS — F329 Major depressive disorder, single episode, unspecified: Secondary | ICD-10-CM

## 2022-05-07 MED ORDER — DULOXETINE HCL 20 MG PO CPEP: 20.0000 mg | ORAL_CAPSULE | Freq: Every day | ORAL | 0 refills | Status: DC

## 2022-05-07 MED ORDER — ARIPIPRAZOLE 5 MG PO TABS: 5.0000 mg | ORAL_TABLET | Freq: Every day | ORAL | 0 refills | Status: DC

## 2022-05-07 NOTE — Progress Notes (Signed)
Name: Cesar Peterson   MRN: [id number]    DOB: May 04, 2000   Date:05/07/2022       Progress Note  Subjective  Chief Complaint  Chief Complaint  Patient presents with   Anxiety   Depression    I connected with  Luberta Robertson  on 05/07/22 at  2:00 PM EST by a video enabled telemedicine application and verified that I am speaking with the correct person using two identifiers.  I discussed the limitations of evaluation and management by telemedicine and the availability of in person appointments. The patient expressed understanding and agreed to proceed with a virtual visit  Staff also discussed with the patient that there may be a patient responsible charge related to this service. Patient Location: home Provider Location: [facility] Additional Individuals present: alone  HPI  Anxiety/depression: patient was prescribed Cymbalta 80 mg daily, Abilify 5 mg daily, Buspar 5 mg three times a day as needed. Patient was to follow up in four weeks from last visit but he did not.   He reports he has been taking cymbalta 80 mg daily, abilify 2 mg daily and buspar as needed. Patient PHQ9 and GAD scores are positive. He reports his depression has increased and that he has a lot going on right now.  Patient reports that he occasionally has passing thoughts of not being here but is not suicidal. Discussed increasing  Abilify to 5 mg daily.  Patient to follow up in four weeks, virtual is fine.  With increasing anxiety and depression may need to send patient to psychiatry.  Patient is resistant but may be necessary.      05/07/2022    2:00 PM 12/29/2021    9:45 AM 12/08/2021    8:25 AM 09/25/2021    8:15 AM 09/07/2021    9:00 AM  Depression screen PHQ 2/9  Decreased Interest 1 3 3  0 1  Down, Depressed, Hopeless 3 3 3  0 2  PHQ - 2 Score 4 6 6  0 3  Altered sleeping 1 3 3  3   Tired, decreased energy 0 2 2  2   Change in appetite 2 3 3  3   Feeling bad or failure about yourself  3 3 3  2   Trouble concentrating 0 0  0  0  Moving slowly or fidgety/restless 0 0 0  0  Suicidal thoughts 1 1 1   0  PHQ-9 Score 11 18 18  13   Difficult doing work/chores Somewhat difficult Very difficult Very difficult  Somewhat difficult       05/07/2022    2:01 PM 12/29/2021    9:46 AM 12/08/2021    8:25 AM 09/07/2021    9:01 AM  GAD 7 : Generalized Anxiety Score  Nervous, Anxious, on Edge 3 3 2 1   Control/stop worrying 3 3 3 2   Worry too much - different things 3 3 3 2   Trouble relaxing 3 0 3 0  Restless 3 0 0 0  Easily annoyed or irritable 3 3 3 1   Afraid - awful might happen 3 3 2  0  Total GAD 7 Score 21 15 16 6   Anxiety Difficulty Very difficult Somewhat difficult Very difficult Somewhat difficult      Patient Active Problem List   Diagnosis Date Noted   Left inguinal pain 08/24/2021   Major depressive disorder with current active episode 08/18/2021   Anxiety 08/18/2021   Insomnia due to other mental disorder 08/18/2021   Closed supracondylar fracture of right humerus 01/19/2021  Social History   Tobacco Use   Smoking status: Never    Passive exposure: Never   Smokeless tobacco: Never  Substance Use Topics   Alcohol use: Never     Current Outpatient Medications:    ARIPiprazole (ABILIFY) 5 MG tablet, Take 1 tablet (5 mg total) by mouth daily., Disp: 30 tablet, Rfl: 0   busPIRone (BUSPAR) 5 MG tablet, Take 1 tablet (5 mg total) by mouth 3 (three) times daily as needed., Disp: 90 tablet, Rfl: 1   DULoxetine (CYMBALTA) 20 MG capsule, Take 1 capsule (20 mg total) by mouth daily. Take with Cymbalta 60 mg for a total of 80 mg daily, Disp: 90 capsule, Rfl: 0   DULoxetine (CYMBALTA) 60 MG capsule, Take 1 capsule (60 mg total) by mouth daily. Take with Cymbalta 20 mg for a total of 80 mg daily., Disp: 90 capsule, Rfl: 0   Suvorexant (BELSOMRA) 5 MG TABS, Take 5 mg by mouth at bedtime as needed., Disp: 30 tablet, Rfl: 0  No Known Allergies  I personally reviewed active problem list, medication list,  allergies, notes from last encounter with the patient/caregiver today.  ROS  Constitutional: Negative for fever or weight change.  Respiratory: Negative for cough and shortness of breath.   Cardiovascular: Negative for chest pain or palpitations.  Gastrointestinal: Negative for abdominal pain, no bowel changes.  Musculoskeletal: Negative for gait problem or joint swelling.  Skin: Negative for rash.  Neurological: Negative for dizziness or headache.  No other specific complaints in a complete review of systems (except as listed in HPI above).   Objective  Virtual encounter, vitals not obtained.  There is no height or weight on file to calculate BMI.  Nursing Note and Vital Signs reviewed.  Physical Exam  Awake, alert and oriented, speaking in complete sentences  No results found for this or any previous visit (from the past 72 hour(s)).  Assessment & Plan  1. Major depressive disorder with current active episode, moderate depression episode severity, recurrent Take Cymbalta 80 mg daily, Abilify 5 mg daily, Buspar 5 mg three times a day as needed - DULoxetine (CYMBALTA) 20 MG capsule; Take 1 capsule (20 mg total) by mouth daily. Take with Cymbalta 60 mg for a total of 80 mg daily  Dispense: 90 capsule; Refill: 0 - ARIPiprazole (ABILIFY) 5 MG tablet; Take 1 tablet (5 mg total) by mouth daily.  Dispense: 30 tablet; Refill: 0 If no improvement may need to refer to psychiatry 2. Anxiety Take Cymbalta 80 mg daily, Abilify 5 mg daily, Buspar 5 mg three times a day as needed - DULoxetine (CYMBALTA) 20 MG capsule; Take 1 capsule (20 mg total) by mouth daily. Take with Cymbalta 60 mg for a total of 80 mg daily  Dispense: 90 capsule; Refill: 0 - ARIPiprazole (ABILIFY) 5 MG tablet; Take 1 tablet (5 mg total) by mouth daily.  Dispense: 30 tablet; Refill: 0  If no improvement may need to refer to psychiatry  -Red flags and when to present for emergency care or RTC including fever >101.37F,  chest pain, shortness of breath, new/worsening/un-resolving symptoms,  reviewed with patient at time of visit. Follow up and care instructions discussed and provided in AVS. - I discussed the assessment and treatment plan with the patient. The patient was provided an opportunity to ask questions and all were answered. The patient agreed with the plan and demonstrated an understanding of the instructions.  I provided 20 minutes of non-face-to-face time during this encounter.  Almyra Free  Hosie Spangle, FNP

## 2022-05-10 DIAGNOSIS — Z419 Encounter for procedure for purposes other than remedying health state, unspecified: Secondary | ICD-10-CM | POA: Diagnosis not present

## 2022-06-06 ENCOUNTER — Telehealth: Payer: Self-pay | Admitting: Nurse Practitioner

## 2022-06-06 NOTE — Telephone Encounter (Unsigned)
Copied from [place] [id number]. Topic: General - Other >> Jun 06, 2022 10:54 AM Chapman Fitch wrote: Reason for CRM: Pt asked to speak with Cesar Peterson about his medication / pt didn't want to say what call is in reference to / please advise

## 2022-06-06 NOTE — Telephone Encounter (Signed)
Would like to discuss increasing medication. Will wait til you return

## 2022-06-07 ENCOUNTER — Telehealth (INDEPENDENT_AMBULATORY_CARE_PROVIDER_SITE_OTHER): Payer: Medicaid Other | Admitting: Nurse Practitioner

## 2022-06-07 ENCOUNTER — Other Ambulatory Visit: Payer: Self-pay

## 2022-06-07 ENCOUNTER — Encounter: Payer: Self-pay | Admitting: Nurse Practitioner

## 2022-06-07 DIAGNOSIS — F419 Anxiety disorder, unspecified: Secondary | ICD-10-CM

## 2022-06-07 DIAGNOSIS — F329 Major depressive disorder, single episode, unspecified: Secondary | ICD-10-CM | POA: Diagnosis not present

## 2022-06-07 DIAGNOSIS — Z79899 Other long term (current) drug therapy: Secondary | ICD-10-CM

## 2022-06-07 DIAGNOSIS — Z131 Encounter for screening for diabetes mellitus: Secondary | ICD-10-CM

## 2022-06-07 DIAGNOSIS — Z130 Encounter for screening for diseases of the blood and blood-forming organs and certain disorders involving the immune mechanism: Secondary | ICD-10-CM

## 2022-06-07 DIAGNOSIS — Z1159 Encounter for screening for other viral diseases: Secondary | ICD-10-CM

## 2022-06-07 DIAGNOSIS — Z13220 Encounter for screening for lipoid disorders: Secondary | ICD-10-CM

## 2022-06-07 DIAGNOSIS — Z114 Encounter for screening for human immunodeficiency virus [HIV]: Secondary | ICD-10-CM

## 2022-06-07 MED ORDER — DULOXETINE HCL 60 MG PO CPEP: 60.0000 mg | ORAL_CAPSULE | Freq: Every day | ORAL | 0 refills | Status: DC

## 2022-06-07 MED ORDER — DULOXETINE HCL 20 MG PO CPEP: 20.0000 mg | ORAL_CAPSULE | Freq: Every day | ORAL | 0 refills | Status: DC

## 2022-06-07 MED ORDER — ARIPIPRAZOLE 10 MG PO TABS: 10.0000 mg | ORAL_TABLET | Freq: Every day | ORAL | 0 refills | Status: DC

## 2022-06-07 NOTE — Assessment & Plan Note (Signed)
Continue Cymbalta 80 mg daily, and increase abilify to 10 mg daily. If no improvement will need to go to psychiatry

## 2022-06-07 NOTE — Progress Notes (Signed)
Name: Cesar Peterson   MRN: [id number]    DOB: 2001/02/08   Date:06/07/2022       Progress Note  Subjective  Chief Complaint  Chief Complaint  Patient presents with   Medication Problem    Discuss increasing medication    I connected with  Luberta Robertson  on 06/07/22 at 1:00 pm by a video enabled telemedicine application and verified that I am speaking with the correct person using two identifiers.  I discussed the limitations of evaluation and management by telemedicine and the availability of in person appointments. The patient expressed understanding and agreed to proceed with a virtual visit  Staff also discussed with the patient that there may be a patient responsible charge related to this service. Patient Location: home Provider Location: [facility] Additional Individuals present: alone  HPI  Depression/anxiety: patient is currently on Cymbalta 80 mg daily, Buspar 5 mg three times a day as needed and Abilify 5 mg daily.  Patient reports he feels like we need to increase his dose.  His PHQ9 score has increased and his GAD score has decreased.  Discussed at last appointment that since his anxiety and depression are worsening and not improving he needs to see psychiatry.  Patient was reluctant.   Patient reports that the abilify helped a little bit and but feels like he needs it increased. Will increase to 10 mg daily.  Also discussed with patient that this will be the last increase, if he needs more increase we will need to get him in with psychiatry. Patient verbalized understanding.  Patient also has not had labs since 2022, will order labs for patient to come by and get done or get done at a [facility] location.     06/07/2022    9:14 AM 05/07/2022    2:00 PM 12/29/2021    9:45 AM 12/08/2021    8:25 AM 09/25/2021    8:15 AM  Depression screen PHQ 2/9  Decreased Interest '3 1 3 3 '$ 0  Down, Depressed, Hopeless '3 3 3 3 '$ 0  PHQ - 2 Score '6 4 6 6 '$ 0  Altered sleeping '3 1 3 3   '$ Tired, decreased  energy 1 0 2 2   Change in appetite '1 2 3 3   '$ Feeling bad or failure about yourself  '2 3 3 3   '$ Trouble concentrating 1 0 0 0   Moving slowly or fidgety/restless 1 0 0 0   Suicidal thoughts 0 '1 1 1   '$ PHQ-9 Score '15 11 18 18   '$ Difficult doing work/chores Very difficult Somewhat difficult Very difficult Very difficult        06/07/2022    9:15 AM 05/07/2022    2:01 PM 12/29/2021    9:46 AM 12/08/2021    8:25 AM  GAD 7 : Generalized Anxiety Score  Nervous, Anxious, on Edge '2 3 3 2  '$ Control/stop worrying '2 3 3 3  '$ Worry too much - different things '3 3 3 3  '$ Trouble relaxing 1 3 0 3  Restless 1 3 0 0  Easily annoyed or irritable '1 3 3 3  '$ Afraid - awful might happen '2 3 3 2  '$ Total GAD 7 Score '12 21 15 16  '$ Anxiety Difficulty Somewhat difficult Very difficult Somewhat difficult Very difficult     Patient Active Problem List   Diagnosis Date Noted   Left inguinal pain 08/24/2021   Major depressive disorder with current active episode 08/18/2021   Anxiety 08/18/2021  Insomnia due to other mental disorder 08/18/2021   Closed supracondylar fracture of right humerus 01/19/2021    Social History   Tobacco Use   Smoking status: Never    Passive exposure: Never   Smokeless tobacco: Never  Substance Use Topics   Alcohol use: Never     Current Outpatient Medications:    busPIRone (BUSPAR) 5 MG tablet, Take 1 tablet (5 mg total) by mouth 3 (three) times daily as needed., Disp: 90 tablet, Rfl: 1   Suvorexant (BELSOMRA) 5 MG TABS, Take 5 mg by mouth at bedtime as needed., Disp: 30 tablet, Rfl: 0   ARIPiprazole (ABILIFY) 10 MG tablet, Take 1 tablet (10 mg total) by mouth daily., Disp: 30 tablet, Rfl: 0   DULoxetine (CYMBALTA) 20 MG capsule, Take 1 capsule (20 mg total) by mouth daily. Take with Cymbalta 60 mg for a total of 80 mg daily, Disp: 90 capsule, Rfl: 0   DULoxetine (CYMBALTA) 60 MG capsule, Take 1 capsule (60 mg total) by mouth daily. Take with Cymbalta 20 mg for a total of 80 mg  daily., Disp: 90 capsule, Rfl: 0  No Known Allergies  I personally reviewed active problem list, medication list, allergies, notes from last encounter with the patient/caregiver today.  ROS  Constitutional: Negative for fever or weight change.  Respiratory: Negative for cough and shortness of breath.   Cardiovascular: Negative for chest pain or palpitations.  Gastrointestinal: Negative for abdominal pain, no bowel changes.  Musculoskeletal: Negative for gait problem or joint swelling.  Skin: Negative for rash.  Neurological: Negative for dizziness or headache.  No other specific complaints in a complete review of systems (except as listed in HPI above).   Objective  Virtual encounter, vitals not obtained.  There is no height or weight on file to calculate BMI.  Nursing Note and Vital Signs reviewed.  Physical Exam  Awake, alert and oriented, speaking in complete sentences  No results found for this or any previous visit (from the past 72 hour(s)).  Assessment & Plan  Problem List Items Addressed This Visit       Other   Major depressive disorder with current active episode - Primary    Continue Cymbalta 80 mg daily, and increase abilify to 10 mg daily. If no improvement will need to go to psychiatry      Relevant Medications   DULoxetine (CYMBALTA) 60 MG capsule   DULoxetine (CYMBALTA) 20 MG capsule   ARIPiprazole (ABILIFY) 10 MG tablet   Anxiety    Continue Cymbalta 80 mg daily, and increase abilify to 10 mg daily. If no improvement will need to go to psychiatry      Relevant Medications   DULoxetine (CYMBALTA) 60 MG capsule   DULoxetine (CYMBALTA) 20 MG capsule   ARIPiprazole (ABILIFY) 10 MG tablet   Other Visit Diagnoses     High risk medication use       Relevant Orders   CBC with Differential/Platelet   COMPLETE METABOLIC PANEL WITH GFR   Lipid panel   Hemoglobin A1c   Screening for HIV without presence of risk factors       Relevant Orders   HIV  Antibody (routine testing w rflx)   Encounter for hepatitis C screening test for low risk patient       Relevant Orders   Hepatitis C antibody   Screening for cholesterol level       Relevant Orders   Lipid panel   Screening for diabetes mellitus  Relevant Orders   COMPLETE METABOLIC PANEL WITH GFR   Hemoglobin A1c   Screening for deficiency anemia       Relevant Orders   CBC with Differential/Platelet        -Red flags and when to present for emergency care or RTC including fever >101.44F, chest pain, shortness of breath, new/worsening/un-resolving symptoms,  reviewed with patient at time of visit. Follow up and care instructions discussed and provided in AVS. - I discussed the assessment and treatment plan with the patient. The patient was provided an opportunity to ask questions and all were answered. The patient agreed with the plan and demonstrated an understanding of the instructions.  I provided 20 minutes of non-face-to-face time during this encounter.  Bo Merino, FNP

## 2022-06-07 NOTE — Telephone Encounter (Signed)
Please call and put patient on schedule to discuss medication virtual

## 2022-06-08 DIAGNOSIS — Z419 Encounter for procedure for purposes other than remedying health state, unspecified: Secondary | ICD-10-CM | POA: Diagnosis not present

## 2022-06-12 ENCOUNTER — Encounter: Payer: Self-pay | Admitting: Nurse Practitioner

## 2022-06-12 ENCOUNTER — Other Ambulatory Visit: Payer: Self-pay

## 2022-06-12 ENCOUNTER — Telehealth (INDEPENDENT_AMBULATORY_CARE_PROVIDER_SITE_OTHER): Payer: Medicaid Other | Admitting: Nurse Practitioner

## 2022-06-12 DIAGNOSIS — F329 Major depressive disorder, single episode, unspecified: Secondary | ICD-10-CM | POA: Diagnosis not present

## 2022-06-12 DIAGNOSIS — F419 Anxiety disorder, unspecified: Secondary | ICD-10-CM

## 2022-06-12 MED ORDER — DULOXETINE HCL 20 MG PO CPEP: 20.0000 mg | ORAL_CAPSULE | Freq: Every day | ORAL | 1 refills | Status: DC

## 2022-06-12 MED ORDER — DULOXETINE HCL 60 MG PO CPEP: 60.0000 mg | ORAL_CAPSULE | Freq: Every day | ORAL | 0 refills | Status: DC

## 2022-06-12 NOTE — Progress Notes (Signed)
Name: Cesar Peterson   MRN: [id number]    DOB: 03-Aug-2000   Date:06/12/2022       Progress Note  Subjective  Chief Complaint  Chief Complaint  Patient presents with   Follow-up   Referral    I connected with  Luberta Robertson  on 06/12/22 at  3:20 PM EST by a video enabled telemedicine application and verified that I am speaking with the correct person using two identifiers.  I discussed the limitations of evaluation and management by telemedicine and the availability of in person appointments. The patient expressed understanding and agreed to proceed with a virtual visit  Staff also discussed with the patient that there may be a patient responsible charge related to this service. Patient Location: home Provider Location: [facility] Additional Individuals present: alone  HPI  Anxiety/depression: patient had virtual appointment on 06/07/2022. He was taking cymbalta 80 mg daily, buspar 5 mg three times a day as needed and Abilify 5 mg daily.  He reported he wanted to increase the Abilify.  Increased dose to 10 mg daily.  Also discussed with patient that if he had any worsening symptoms would need to refer to psychiatry. PHQ9 and GAD scores continue to worsen. Patient reports today that he has not been doing well due to having trouble getting his medication. He says that he was having an issue with the pharmacy.  Discussed we will call pharmacy and call him back. He is also ready to see psychiatry. [facility] ( [facility] and faxed)     06/12/2022    3:21 PM 06/07/2022    9:14 AM 05/07/2022    2:00 PM 12/29/2021    9:45 AM 12/08/2021    8:25 AM  Depression screen PHQ 2/9  Decreased Interest '3 3 1 3 3  '$ Down, Depressed, Hopeless '3 3 3 3 3  '$ PHQ - 2 Score '6 6 4 6 6  '$ Altered sleeping '3 3 1 3 3  '$ Tired, decreased energy 3 1 0 2 2  Change in appetite '1 1 2 3 3  '$ Feeling bad or failure about yourself  '3 2 3 3 3  '$ Trouble concentrating 3 1 0 0 0  Moving slowly or  fidgety/restless 1 1 0 0 0  Suicidal thoughts 1 0 '1 1 1  '$ PHQ-9 Score '21 15 11 18 18  '$ Difficult doing work/chores Somewhat difficult Very difficult Somewhat difficult Very difficult Very difficult       06/12/2022    3:22 PM 06/07/2022    9:15 AM 05/07/2022    2:01 PM 12/29/2021    9:46 AM  GAD 7 : Generalized Anxiety Score  Nervous, Anxious, on Edge '3 2 3 3  '$ Control/stop worrying '3 2 3 3  '$ Worry too much - different things '3 3 3 3  '$ Trouble relaxing '1 1 3 '$ 0  Restless '1 1 3 '$ 0  Easily annoyed or irritable '1 1 3 3  '$ Afraid - awful might happen '1 2 3 3  '$ Total GAD 7 Score '13 12 21 15  '$ Anxiety Difficulty Somewhat difficult Somewhat difficult Very difficult Somewhat difficult      Patient Active Problem List   Diagnosis Date Noted   Left inguinal pain 08/24/2021   Major depressive disorder with current active episode 08/18/2021   Anxiety 08/18/2021   Insomnia due to other mental disorder 08/18/2021   Closed supracondylar fracture of right humerus 01/19/2021    Social History   Tobacco Use   Smoking  status: Never    Passive exposure: Never   Smokeless tobacco: Never  Substance Use Topics   Alcohol use: Never     Current Outpatient Medications:    ARIPiprazole (ABILIFY) 10 MG tablet, Take 1 tablet (10 mg total) by mouth daily., Disp: 30 tablet, Rfl: 0   busPIRone (BUSPAR) 5 MG tablet, Take 1 tablet (5 mg total) by mouth 3 (three) times daily as needed., Disp: 90 tablet, Rfl: 1   DULoxetine (CYMBALTA) 20 MG capsule, Take 1 capsule (20 mg total) by mouth daily. Take with Cymbalta 60 mg for a total of 80 mg daily, Disp: 90 capsule, Rfl: 0   DULoxetine (CYMBALTA) 60 MG capsule, Take 1 capsule (60 mg total) by mouth daily. Take with Cymbalta 20 mg for a total of 80 mg daily., Disp: 90 capsule, Rfl: 0   Suvorexant (BELSOMRA) 5 MG TABS, Take 5 mg by mouth at bedtime as needed., Disp: 30 tablet, Rfl: 0  No Known Allergies  I personally reviewed active problem list, medication list,  allergies, notes from last encounter with the patient/caregiver today.  ROS  Constitutional: Negative for fever or weight change.  Respiratory: Negative for cough and shortness of breath.   Cardiovascular: Negative for chest pain or palpitations.  Gastrointestinal: Negative for abdominal pain, no bowel changes.  Musculoskeletal: Negative for gait problem or joint swelling.  Skin: Negative for rash.  Neurological: Negative for dizziness or headache.  No other specific complaints in a complete review of systems (except as listed in HPI above).   Objective  Virtual encounter, vitals not obtained.  There is no height or weight on file to calculate BMI.  Nursing Note and Vital Signs reviewed.  Physical Exam  Awake, alert and oriented, speaking in complete sentences  No results found for this or any previous visit (from the past 72 hour(s)).  Assessment & Plan  1. Major depressive disorder with current active episode, moderate depression episode severity, recurrent [facility]  2. Anxiety Referral placed to [facility]   -Red flags and when to present for emergency care or RTC including fever >101.63F, chest pain, shortness of breath, new/worsening/un-resolving symptoms,  reviewed with patient at time of visit. Follow up and care instructions discussed and provided in AVS. - I discussed the assessment and treatment plan with the patient. The patient was provided an opportunity to ask questions and all were answered. The patient agreed with the plan and demonstrated an understanding of the instructions.  I provided 15 minutes of non-face-to-face time during this encounter.  Bo Merino, FNP

## 2022-06-12 NOTE — Addendum Note (Signed)
Addended by: Serafina Royals F on: 06/12/2022 03:39 PM   Modules accepted: Orders

## 2022-06-13 ENCOUNTER — Telehealth: Payer: Self-pay | Admitting: Emergency Medicine

## 2022-06-13 ENCOUNTER — Other Ambulatory Visit: Payer: Self-pay | Admitting: Nurse Practitioner

## 2022-06-13 DIAGNOSIS — F329 Major depressive disorder, single episode, unspecified: Secondary | ICD-10-CM

## 2022-06-13 DIAGNOSIS — F419 Anxiety disorder, unspecified: Secondary | ICD-10-CM

## 2022-06-13 NOTE — Telephone Encounter (Signed)
[place] called and spoke to [place], [employer] about the refill(s) abilify 10 requested. Advised it was sent on 06/07/22 #30/0 refill(s). She states that it was received and picked up on 06/10/22. Current request is for '5mg'$  which pt is no longer on. Will send to practice to refuse d/t not delegated.   Requested Prescriptions  Pending Prescriptions Disp Refills   ARIPiprazole (ABILIFY) 5 MG tablet [Pharmacy Med Name: ARIPIPRAZOLE '5MG'$  TABLETS] 30 tablet 0    Sig: TAKE 1 TABLET(5 MG) BY MOUTH DAILY     Not Delegated - Psychiatry:  Antipsychotics - Second Generation (Atypical) - aripiprazole Failed - 06/13/2022  8:40 AM      Failed - This refill cannot be delegated      Failed - TSH in normal range and within 360 days    No results found for: "TSH", "[place]", "[place]"       Failed - Lipid Panel in normal range within the last 12 months    No results found for: "CHOL", "POCCHOL", "CHOLTOT" No results found for: "[place]", "LDLC", "HIRISKLDL", "POCLDL", "LDLDIRECT", "REALLDLC", "TOTLDLC" No results found for: "HDL", "POCHDL" No results found for: "TRIG", "POCTRIG"       Failed - CBC within normal limits and completed in the last 12 months    No results found for: "WBC", "[place]" No results found for: "RBC", "RBCKUC" No results found for: "HGB", "[place]", "[place]", "HGBOTHER", "TOTHGB", "HGBPLASMA", "LABHEMOF" No results found for: "HCT", "HCTKUC", "SRHCT" No results found for: "MCHC", "[place]" No results found for: "[place]", "[place]" No results found for: "[place]", "MCV" No results found for: "PLTCOUNTKUC", "LABPLAT", "POCPLA" No results found for: "RDW", "[place]", "POCRDW"       Failed - CMP within normal limits and completed in the last 12 months    No results found for: "ALBUMIN", "ALBMS", "POCALB", "ALBUMINELP" No results found for: "POCALP", "ALKPHOS", "ALKPHOSAPISO", "ALKPHOSISO" No results found for: "ALT", "LABALT", "POCALT" No results found for: "POCAST", "AST" No  results found for: "BUN", "POCBUN" No results found for: "CALCIUM", "CORRECTEDCA", "CAWHOLEBLD", "POCCA", "CALION", "CAION", "POCICA" No results found for: "POCCO2", "CO2", "HCO3", "TCO2", "POCTCO2", "HCO3CART" No results found for: "CREATININE", "LABCREAU", "LABCREA", "POCCRE" No results found for: "GLUCOSE", "GLU", "GLUF", "LABGLUC", "POCTGLUC", "POCGLU", "GLUCFASTPOC", "GLUCAP", "LTTFBG" No results found for: "K", "POTASSIUM", "POCK" No results found for: "NA", "POCNA" No results found for: "BILITOT", "LABBILI", "BILIDIRECT", "BILIDIR", "IBILI" No results found for: "URPROTEIN", "PROTEINUR", "PROTUR", "PROTEIN24HR", "UTOPR", "UP24", "TOTPROTUR", "TOTPRO", "POCPROT", "PROT", "TOTALPROTELP", "PROTEIN" No results found for: "GFRAA", "GFR", "EGFR", "GFRNONAA"       Passed - Completed PHQ-2 or PHQ-9 in the last 360 days      Passed - Last BP in normal range    BP Readings from Last 1 Encounters:  12/08/21 122/70         Passed - Last Heart Rate in normal range    Pulse Readings from Last 1 Encounters:  12/08/21 91         Passed - Valid encounter within last 6 months    Recent Outpatient Visits           Yesterday Major depressive disorder with current active episode, moderate depression episode severity, recurrent   [place], Julie F, FNP   6 days ago Major depressive disorder with current active episode, moderate depression episode severity, recurrent   [place], Julie F, FNP   1 month ago Major depressive disorder with current active episode, moderate depression episode severity, recurrent  [facility] Serafina Royals F, FNP   5 months ago Major depressive disorder with current active episode, moderate depression episode severity, recurrent   [facility] Bo Merino, FNP   6 months ago [facility] Bo Merino,  [place]

## 2022-06-13 NOTE — Telephone Encounter (Signed)
Patient called and stated that he was not able to get his Cymbalta scripts from Pharmacy. I called [facility] and they stated on patient profile he got #90 on January 5. Pharmacy stated it is to early to refill and patient cannot get until 3/12. Pharamacy also stated they cannot let him buy a week worth of pills due to fact he was on Medicaid. Patient was notified and was told he need to call [facility] to be evaluated.  Per mother Katharine Look) patient has been getting medication from different pharmacy since he moved to [place]. Last refill was in [place]. This refill will be [facility]. All other scripts was cancelled out at [facility] in [place]

## 2022-06-13 NOTE — Telephone Encounter (Signed)
Requested medication (s) are due for refill today: no  Requested medication (s) are on the active medication list: no dose was increased  Last refill:    Future visit scheduled: no  Notes to clinic:  not delegated, needing refused d/t wrong dose     Requested Prescriptions  Pending Prescriptions Disp Refills   ARIPiprazole (ABILIFY) 5 MG tablet [Pharmacy Med Name: ARIPIPRAZOLE '5MG'$  TABLETS] 30 tablet 0    Sig: TAKE 1 TABLET(5 MG) BY MOUTH DAILY     Not Delegated - Psychiatry:  Antipsychotics - Second Generation (Atypical) - aripiprazole Failed - 06/13/2022  8:40 AM      Failed - This refill cannot be delegated      Failed - TSH in normal range and within 360 days    No results found for: "TSH", "[place]", "[place]"       Failed - Lipid Panel in normal range within the last 12 months    No results found for: "CHOL", "POCCHOL", "CHOLTOT" No results found for: "[place]", "LDLC", "HIRISKLDL", "POCLDL", "LDLDIRECT", "REALLDLC", "TOTLDLC" No results found for: "HDL", "POCHDL" No results found for: "TRIG", "POCTRIG"       Failed - CBC within normal limits and completed in the last 12 months    No results found for: "WBC", "[place]" No results found for: "RBC", "[place]" No results found for: "HGB", "[place]", "[place]", "HGBOTHER", "TOTHGB", "HGBPLASMA", "LABHEMOF" No results found for: "HCT", "HCTKUC", "SRHCT" No results found for: "MCHC", "[place]" No results found for: "[place]", "[place]" No results found for: "[place]", "MCV" No results found for: "PLTCOUNTKUC", "LABPLAT", "POCPLA" No results found for: "RDW", "[place]", "POCRDW"       Failed - CMP within normal limits and completed in the last 12 months    No results found for: "ALBUMIN", "ALBMS", "POCALB", "ALBUMINELP" No results found for: "POCALP", "ALKPHOS", "ALKPHOSAPISO", "ALKPHOSISO" No results found for: "ALT", "LABALT", "POCALT" No results found for: "POCAST", "AST" No results found for: "BUN", "POCBUN" No results found  for: "CALCIUM", "CORRECTEDCA", "CAWHOLEBLD", "POCCA", "CALION", "CAION", "POCICA" No results found for: "POCCO2", "CO2", "HCO3", "TCO2", "POCTCO2", "HCO3CART" No results found for: "CREATININE", "LABCREAU", "LABCREA", "POCCRE" No results found for: "GLUCOSE", "GLU", "GLUF", "LABGLUC", "POCTGLUC", "POCGLU", "GLUCFASTPOC", "GLUCAP", "LTTFBG" No results found for: "K", "POTASSIUM", "POCK" No results found for: "NA", "POCNA" No results found for: "BILITOT", "LABBILI", "BILIDIRECT", "BILIDIR", "IBILI" No results found for: "URPROTEIN", "PROTEINUR", "PROTUR", "PROTEIN24HR", "UTOPR", "UP24", "TOTPROTUR", "TOTPRO", "POCPROT", "PROT", "TOTALPROTELP", "PROTEIN" No results found for: "GFRAA", "GFR", "EGFR", "GFRNONAA"       Passed - Completed PHQ-2 or PHQ-9 in the last 360 days      Passed - Last BP in normal range    BP Readings from Last 1 Encounters:  12/08/21 122/70         Passed - Last Heart Rate in normal range    Pulse Readings from Last 1 Encounters:  12/08/21 91         Passed - Valid encounter within last 6 months    Recent Outpatient Visits           Yesterday Major depressive disorder with current active episode, moderate depression episode severity, recurrent   [place], Julie F, FNP   6 days ago Major depressive disorder with current active episode, moderate depression episode severity, recurrent   [place], Julie F, FNP   1 month ago Major depressive disorder with current active episode, moderate depression episode severity, recurrent   [place]  Bo Merino, FNP   5 months ago Major depressive disorder with current active episode, moderate depression episode severity, recurrent   [facility] Bo Merino, FNP   6 months ago [facility] Bo Merino, [place]

## 2022-07-07 ENCOUNTER — Other Ambulatory Visit: Payer: Self-pay | Admitting: Nurse Practitioner

## 2022-07-07 DIAGNOSIS — F329 Major depressive disorder, single episode, unspecified: Secondary | ICD-10-CM

## 2022-07-07 DIAGNOSIS — F419 Anxiety disorder, unspecified: Secondary | ICD-10-CM

## 2022-07-09 DIAGNOSIS — Z419 Encounter for procedure for purposes other than remedying health state, unspecified: Secondary | ICD-10-CM | POA: Diagnosis not present

## 2022-07-09 NOTE — Telephone Encounter (Signed)
Patient notified to get labs and set up appointment with psychiatry

## 2022-07-09 NOTE — Telephone Encounter (Signed)
Requested medication (s) are due for refill today: yes  Requested medication (s) are on the active medication list: yes  Last refill:  06/07/22  Future visit scheduled: yes  Notes to clinic:  Unable to refill per protocol, cannot delegate.      Requested Prescriptions  Pending Prescriptions Disp Refills   ARIPiprazole (ABILIFY) 10 MG tablet [Pharmacy Med Name: ARIPIPRAZOLE 10MG  TABLETS] 30 tablet 0    Sig: TAKE 1 TABLET(10 MG) BY MOUTH DAILY     Not Delegated - Psychiatry:  Antipsychotics - Second Generation (Atypical) - aripiprazole Failed - 07/07/2022  7:22 AM      Failed - This refill cannot be delegated      Failed - TSH in normal range and within 360 days    No results found for: "TSH", "[place]", "[place]"       Failed - Lipid Panel in normal range within the last 12 months    No results found for: "CHOL", "POCCHOL", "CHOLTOT" No results found for: "[place]", "LDLC", "HIRISKLDL", "POCLDL", "LDLDIRECT", "REALLDLC", "TOTLDLC" No results found for: "HDL", "POCHDL" No results found for: "TRIG", "POCTRIG"       Failed - CBC within normal limits and completed in the last 12 months    No results found for: "WBC", "[place]" No results found for: "RBC", "RBCKUC" No results found for: "HGB", "[place]", "[place]", "HGBOTHER", "TOTHGB", "HGBPLASMA", "LABHEMOF" No results found for: "HCT", "HCTKUC", "SRHCT" No results found for: "MCHC", "[place]" No results found for: "[place]", "[place]" No results found for: "[place]", "MCV" No results found for: "PLTCOUNTKUC", "LABPLAT", "POCPLA" No results found for: "RDW", "[place]", "POCRDW"       Failed - CMP within normal limits and completed in the last 12 months    No results found for: "ALBUMIN", "ALBMS", "POCALB", "ALBUMINELP" No results found for: "POCALP", "ALKPHOS", "ALKPHOSAPISO", "ALKPHOSISO" No results found for: "ALT", "LABALT", "POCALT" No results found for: "POCAST", "AST" No results found for: "BUN", "POCBUN" No results found  for: "CALCIUM", "CORRECTEDCA", "CAWHOLEBLD", "POCCA", "CALION", "CAION", "POCICA" No results found for: "POCCO2", "CO2", "HCO3", "TCO2", "POCTCO2", "HCO3CART" No results found for: "CREATININE", "LABCREAU", "LABCREA", "POCCRE" No results found for: "GLUCOSE", "GLU", "GLUF", "LABGLUC", "POCTGLUC", "POCGLU", "GLUCFASTPOC", "GLUCAP", "LTTFBG" No results found for: "K", "POTASSIUM", "POCK" No results found for: "NA", "POCNA" No results found for: "BILITOT", "LABBILI", "BILIDIRECT", "BILIDIR", "IBILI" No results found for: "URPROTEIN", "PROTEINUR", "PROTUR", "PROTEIN24HR", "UTOPR", "UP24", "TOTPROTUR", "TOTPRO", "POCPROT", "PROT", "TOTALPROTELP", "PROTEIN" No results found for: "GFRAA", "GFR", "EGFR", "GFRNONAA"       Passed - Completed PHQ-2 or PHQ-9 in the last 360 days      Passed - Last BP in normal range    BP Readings from Last 1 Encounters:  12/08/21 122/70         Passed - Last Heart Rate in normal range    Pulse Readings from Last 1 Encounters:  12/08/21 91         Passed - Valid encounter within last 6 months    Recent Outpatient Visits           3 weeks ago Major depressive disorder with current active episode, moderate depression episode severity, recurrent   [place], Julie F, FNP   1 month ago Major depressive disorder with current active episode, moderate depression episode severity, recurrent   [place], Julie F, FNP   2 months ago Major depressive disorder with current active episode, moderate depression episode severity, recurrent   [facility]  Bo Merino, FNP   6 months ago Major depressive disorder with current active episode, moderate depression episode severity, recurrent   [facility] Bo Merino, FNP   7 months ago [facility] Bo Merino, [place]

## 2022-08-07 ENCOUNTER — Other Ambulatory Visit: Payer: Self-pay | Admitting: Nurse Practitioner

## 2022-08-07 DIAGNOSIS — F329 Major depressive disorder, single episode, unspecified: Secondary | ICD-10-CM

## 2022-08-07 DIAGNOSIS — F419 Anxiety disorder, unspecified: Secondary | ICD-10-CM

## 2022-08-08 ENCOUNTER — Encounter: Payer: Self-pay | Admitting: Nurse Practitioner

## 2022-08-08 ENCOUNTER — Other Ambulatory Visit: Payer: Self-pay | Admitting: Nurse Practitioner

## 2022-08-08 DIAGNOSIS — F329 Major depressive disorder, single episode, unspecified: Secondary | ICD-10-CM

## 2022-08-08 DIAGNOSIS — F419 Anxiety disorder, unspecified: Secondary | ICD-10-CM

## 2022-08-08 DIAGNOSIS — Z419 Encounter for procedure for purposes other than remedying health state, unspecified: Secondary | ICD-10-CM | POA: Diagnosis not present

## 2022-08-08 MED ORDER — DULOXETINE HCL 20 MG PO CPEP: 20.0000 mg | ORAL_CAPSULE | Freq: Every day | ORAL | 0 refills | Status: DC

## 2022-08-08 MED ORDER — ARIPIPRAZOLE 10 MG PO TABS
ORAL_TABLET | ORAL | 0 refills | Status: DC
Start: 2022-08-08 — End: 2022-09-25

## 2022-08-08 MED ORDER — DULOXETINE HCL 60 MG PO CPEP
60.0000 mg | ORAL_CAPSULE | Freq: Every day | ORAL | 0 refills | Status: DC
Start: 2022-08-08 — End: 2022-09-25

## 2022-08-08 MED ORDER — BUSPIRONE HCL 5 MG PO TABS
5.0000 mg | ORAL_TABLET | Freq: Three times a day (TID) | ORAL | 0 refills | Status: DC | PRN
Start: 2022-08-08 — End: 2022-09-25

## 2022-08-08 NOTE — Telephone Encounter (Signed)
Requested medication (s) are due for refill today - yes  Requested medication (s) are on the active medication list -yes  Future visit scheduled -no  Last refill: 07/09/22 #30  Notes to clinic: non delegated Rx, fails lab protocol- missing labs  Requested Prescriptions  Pending Prescriptions Disp Refills   ARIPiprazole (ABILIFY) 10 MG tablet [Pharmacy Med Name: ARIPIPRAZOLE 10MG  TABLETS] 30 tablet 0    Sig: TAKE 1 TABLET(10 MG) BY MOUTH DAILY     Not Delegated - Psychiatry:  Antipsychotics - Second Generation (Atypical) - aripiprazole Failed - 08/07/2022  3:42 AM      Failed - This refill cannot be delegated      Failed - TSH in normal range and within 360 days    No results found for: "TSH", "POCTSH", "TSHREFLEX"       Failed - Lipid Panel in normal range within the last 12 months    No results found for: "CHOL", "POCCHOL", "CHOLTOT" No results found for: "LDLCALC", "LDLC", "HIRISKLDL", "POCLDL", "LDLDIRECT", "REALLDLC", "TOTLDLC" No results found for: "HDL", "POCHDL" No results found for: "TRIG", "POCTRIG"       Failed - CBC within normal limits and completed in the last 12 months    No results found for: "WBC", "WBCKUC" No results found for: "RBC", "RBCKUC" No results found for: "HGB", "HGBKUC", "HGBPOCKUC", "HGBOTHER", "TOTHGB", "HGBPLASMA", "LABHEMOF" No results found for: "HCT", "HCTKUC", "SRHCT" No results found for: "MCHC", "MCHCKUC" No results found for: "MCH", "MCHKUC" No results found for: "MCVKUC", "MCV" No results found for: "PLTCOUNTKUC", "LABPLAT", "POCPLA" No results found for: "RDW", "RDWKUC", "POCRDW"       Failed - CMP within normal limits and completed in the last 12 months    No results found for: "ALBUMIN", "ALBMS", "POCALB", "ALBUMINELP" No results found for: "POCALP", "ALKPHOS", "ALKPHOSAPISO", "ALKPHOSISO" No results found for: "ALT", "LABALT", "POCALT" No results found for: "POCAST", "AST" No results found for: "BUN", "POCBUN" No results found  for: "CALCIUM", "CORRECTEDCA", "CAWHOLEBLD", "POCCA", "CALION", "CAION", "POCICA" No results found for: "POCCO2", "CO2", "HCO3", "TCO2", "POCTCO2", "HCO3CART" No results found for: "CREATININE", "LABCREAU", "LABCREA", "POCCRE" No results found for: "GLUCOSE", "GLU", "GLUF", "LABGLUC", "POCTGLUC", "POCGLU", "GLUCFASTPOC", "GLUCAP", "LTTFBG" No results found for: "K", "POTASSIUM", "POCK" No results found for: "NA", "POCNA" No results found for: "BILITOT", "LABBILI", "BILIDIRECT", "BILIDIR", "IBILI" No results found for: "URPROTEIN", "PROTEINUR", "PROTUR", "PROTEIN24HR", "UTOPR", "UP24", "TOTPROTUR", "TOTPRO", "POCPROT", "PROT", "TOTALPROTELP", "PROTEIN" No results found for: "GFRAA", "GFR", "EGFR", "GFRNONAA"       Passed - Completed PHQ-2 or PHQ-9 in the last 360 days      Passed - Last BP in normal range    BP Readings from Last 1 Encounters:  12/08/21 122/70         Passed - Last Heart Rate in normal range    Pulse Readings from Last 1 Encounters:  12/08/21 91         Passed - Valid encounter within last 6 months    Recent Outpatient Visits           1 month ago Major depressive disorder with current active episode, moderate depression episode severity, recurrent   [facility] Berniece Salines, FNP   2 months ago Major depressive disorder with current active episode, moderate depression episode severity, recurrent   [facility] Berniece Salines, FNP   3 months ago Major depressive disorder with current active episode, moderate depression episode severity, recurrent   [facility] Berniece Salines,  FNP   7 months ago Major depressive disorder with current active episode, moderate depression episode severity, recurrent   [facility] Berniece Salines, FNP   8 months ago Anxiety   [facility] Berniece Salines, [place]                 Requested  Prescriptions  Pending Prescriptions Disp Refills   ARIPiprazole (ABILIFY) 10 MG tablet [Pharmacy Med Name: ARIPIPRAZOLE 10MG  TABLETS] 30 tablet 0    Sig: TAKE 1 TABLET(10 MG) BY MOUTH DAILY     Not Delegated - Psychiatry:  Antipsychotics - Second Generation (Atypical) - aripiprazole Failed - 08/07/2022  3:42 AM      Failed - This refill cannot be delegated      Failed - TSH in normal range and within 360 days    No results found for: "TSH", "POCTSH", "TSHREFLEX"       Failed - Lipid Panel in normal range within the last 12 months    No results found for: "CHOL", "POCCHOL", "CHOLTOT" No results found for: "LDLCALC", "LDLC", "HIRISKLDL", "POCLDL", "LDLDIRECT", "REALLDLC", "TOTLDLC" No results found for: "HDL", "POCHDL" No results found for: "TRIG", "POCTRIG"       Failed - CBC within normal limits and completed in the last 12 months    No results found for: "WBC", "WBCKUC" No results found for: "RBC", "RBCKUC" No results found for: "HGB", "HGBKUC", "HGBPOCKUC", "HGBOTHER", "TOTHGB", "HGBPLASMA", "LABHEMOF" No results found for: "HCT", "HCTKUC", "SRHCT" No results found for: "MCHC", "MCHCKUC" No results found for: "MCH", "MCHKUC" No results found for: "MCVKUC", "MCV" No results found for: "PLTCOUNTKUC", "LABPLAT", "POCPLA" No results found for: "RDW", "RDWKUC", "POCRDW"       Failed - CMP within normal limits and completed in the last 12 months    No results found for: "ALBUMIN", "ALBMS", "POCALB", "ALBUMINELP" No results found for: "POCALP", "ALKPHOS", "ALKPHOSAPISO", "ALKPHOSISO" No results found for: "ALT", "LABALT", "POCALT" No results found for: "POCAST", "AST" No results found for: "BUN", "POCBUN" No results found for: "CALCIUM", "CORRECTEDCA", "CAWHOLEBLD", "POCCA", "CALION", "CAION", "POCICA" No results found for: "POCCO2", "CO2", "HCO3", "TCO2", "POCTCO2", "HCO3CART" No results found for: "CREATININE", "LABCREAU", "LABCREA", "POCCRE" No results found for: "GLUCOSE",  "GLU", "GLUF", "LABGLUC", "POCTGLUC", "POCGLU", "GLUCFASTPOC", "GLUCAP", "LTTFBG" No results found for: "K", "POTASSIUM", "POCK" No results found for: "NA", "POCNA" No results found for: "BILITOT", "LABBILI", "BILIDIRECT", "BILIDIR", "IBILI" No results found for: "URPROTEIN", "PROTEINUR", "PROTUR", "PROTEIN24HR", "UTOPR", "UP24", "TOTPROTUR", "TOTPRO", "POCPROT", "PROT", "TOTALPROTELP", "PROTEIN" No results found for: "GFRAA", "GFR", "EGFR", "GFRNONAA"       Passed - Completed PHQ-2 or PHQ-9 in the last 360 days      Passed - Last BP in normal range    BP Readings from Last 1 Encounters:  12/08/21 122/70         Passed - Last Heart Rate in normal range    Pulse Readings from Last 1 Encounters:  12/08/21 91         Passed - Valid encounter within last 6 months    Recent Outpatient Visits           1 month ago Major depressive disorder with current active episode, moderate depression episode severity, recurrent   [facility] Berniece Salines, FNP   2 months ago Major depressive disorder with current active episode, moderate depression episode severity, recurrent   [facility] Berniece Salines, FNP   3 months ago Major depressive disorder  with current active episode, moderate depression episode severity, recurrent   [facility] Della Goo F, FNP   7 months ago Major depressive disorder with current active episode, moderate depression episode severity, recurrent   [facility] Berniece Salines, FNP   8 months ago Anxiety   [facility] Berniece Salines, [place]

## 2022-08-08 NOTE — Progress Notes (Signed)
Patient was told that he needed to follow up with psychiatry and he no showed two appointments [facility].  He has not been consistent with his medications and has not followed care plan.  Patient given thirty day supply of medications and will be sent dismissal letter.

## 2022-09-08 DIAGNOSIS — Z419 Encounter for procedure for purposes other than remedying health state, unspecified: Secondary | ICD-10-CM | POA: Diagnosis not present

## 2022-09-25 ENCOUNTER — Encounter: Payer: Self-pay | Admitting: Family Medicine

## 2022-09-25 ENCOUNTER — Ambulatory Visit (INDEPENDENT_AMBULATORY_CARE_PROVIDER_SITE_OTHER): Payer: Medicaid Other | Admitting: Family Medicine

## 2022-09-25 VITALS — BP 118/75 | HR 92 | Temp 98.7°F | Resp 20 | Ht 72.0 in | Wt 212.3 lb

## 2022-09-25 DIAGNOSIS — Z7689 Persons encountering health services in other specified circumstances: Secondary | ICD-10-CM | POA: Diagnosis not present

## 2022-09-25 DIAGNOSIS — F329 Major depressive disorder, single episode, unspecified: Secondary | ICD-10-CM | POA: Diagnosis not present

## 2022-09-25 DIAGNOSIS — F411 Generalized anxiety disorder: Secondary | ICD-10-CM | POA: Diagnosis not present

## 2022-09-25 MED ORDER — DULOXETINE HCL 20 MG PO CPEP
20.0000 mg | ORAL_CAPSULE | Freq: Every day | ORAL | 0 refills | Status: AC
Start: 2022-09-25 — End: ?

## 2022-09-25 MED ORDER — DULOXETINE HCL 60 MG PO CPEP
60.0000 mg | ORAL_CAPSULE | Freq: Every day | ORAL | 0 refills | Status: AC
Start: 2022-09-25 — End: ?

## 2022-09-25 NOTE — Progress Notes (Signed)
New Patient Office Visit  Subjective    Patient ID: SHAHEIM MCADEN, male    DOB: 2000/06/09  Age: 22 y.o. MRN: [id number]  CC:  Chief Complaint  Patient presents with   Establish Care    Patient is here to establish care with new PCP, he states that he does have depression and has ran out of his medication : Duloxetine 80 mg yesterday    HPI Cesar Peterson presents to establish care with this practice. He is new to me. Reports history of depression and ran out of Duloxetine yesterday. Recently moved to [place] and was having difficulty getting to appointments in [place].  Chart review reveals he was dismissed from previous practice due to missing appointments and being inconsistent with medications. Explained that in order for him to receive the best care it is important to keep his appointments. Explained that psychiatry will only let you miss an appointment one time before dismissing him. He understands and feels like he will be able to get a ride to appointments in [place]. Will refer to psychiatry today.   Moderate depression: Currently only taking duloxetine 80 mg, has not taken today, ran out yesterday. Usually takes dose in the evening. Refill to be sent today.   GAD: Currently only taking duloxetine 80 mg, has not taken today, ran out yesterday. Usually takes dose in the evening. Refill to be sent today.    History reviewed. Medications reviewed. Updated medication list. Taking duloxetine 80 mg daily as previously prescribed. Tolerates well without side effects reported. Flowsheet Row Office Visit from 09/25/2022 in [facility] Total Score 16     Reports having random thoughts of self-harm, never has a plan. Contracts for safety and discussed what to do if his thoughts become intrusive. He will seek help at the emergency department.     09/25/2022   10:53 AM 06/12/2022    3:22 PM 06/07/2022    9:15 AM 05/07/2022    2:01 PM   GAD 7 : Generalized Anxiety Score  Nervous, Anxious, on Edge 2 3 2 3   Control/stop worrying 3 3 2 3   Worry too much - different things 3 3 3 3   Trouble relaxing 1 1 1 3   Restless 1 1 1 3   Easily annoyed or irritable 1 1 1 3   Afraid - awful might happen 2 1 2 3   Total GAD 7 Score 13 13 12 21   Anxiety Difficulty Somewhat difficult Somewhat difficult Somewhat difficult Very difficult  Referral to psychiatry today for ongoing care.       Outpatient Encounter Medications as of 09/25/2022  Medication Sig   [DISCONTINUED] ARIPiprazole (ABILIFY) 10 MG tablet TAKE 1 TABLET(10 MG) BY MOUTH DAILY   [DISCONTINUED] busPIRone (BUSPAR) 5 MG tablet Take 1 tablet (5 mg total) by mouth 3 (three) times daily as needed.   [DISCONTINUED] DULoxetine (CYMBALTA) 20 MG capsule Take 1 capsule (20 mg total) by mouth daily. Take with Cymbalta 60 mg for a total of 80 mg daily   [DISCONTINUED] DULoxetine (CYMBALTA) 60 MG capsule Take 1 capsule (60 mg total) by mouth daily. Take with Cymbalta 20 mg for a total of 80 mg daily.   DULoxetine (CYMBALTA) 20 MG capsule Take 1 capsule (20 mg total) by mouth daily. Take with Cymbalta 60 mg for a total of 80 mg daily   DULoxetine (CYMBALTA) 60 MG capsule Take 1 capsule (60 mg total) by mouth daily. Take with Cymbalta 20  mg for a total of 80 mg daily.   No facility-administered encounter medications on file as of 09/25/2022.    Past Medical History:  Diagnosis Date   Anxiety    Depression     Past Surgical History:  Procedure Laterality Date   ELBOW FRACTURE SURGERY Right    HERNIA REPAIR      Family History  Problem Relation Age of Onset   Healthy Mother    Healthy Father     Social History   Socioeconomic History   Marital status: Single    Spouse name: Not on file   Number of children: 0   Years of education: Not on file   Highest education level: Not on file  Occupational History   Not on file  Tobacco Use   Smoking status: Never    Passive  exposure: Never   Smokeless tobacco: Current   Tobacco comments:    vapes  Vaping Use   Vaping Use: Every day  Substance and Sexual Activity   Alcohol use: Never   Drug use: Never   Sexual activity: Not Currently  Other Topics Concern   Not on file  Social History Narrative   Not on file   Social Determinants of Health   Financial Resource Strain: Not on file  Food Insecurity: Not on file  Transportation Needs: Not on file  Physical Activity: Not on file  Stress: Not on file  Social Connections: Not on file  Intimate Partner Violence: Not on file    Review of Systems  Constitutional:  Negative for chills, fever, malaise/fatigue and weight loss.  Eyes:  Negative for blurred vision and double vision.  Respiratory:  Negative for shortness of breath.   Cardiovascular:  Negative for chest pain, palpitations and leg swelling.  Gastrointestinal:  Positive for nausea (random, resolves after a few hours, present for few months). Negative for abdominal pain, constipation, diarrhea and vomiting.  Neurological:  Positive for headaches (when nausea).  Psychiatric/Behavioral:  Positive for depression. Negative for suicidal ideas. The patient is nervous/anxious and has insomnia (sleeping "really hard for me").         Objective    BP 118/75   Pulse 92   Temp 98.7 F (37.1 C) (Oral)   Resp 20   Ht 6' (1.829 m)   Wt 212 lb 4.8 oz (96.3 kg)   SpO2 99%   BMI 28.79 kg/m   Physical Exam Vitals and nursing note reviewed.  Constitutional:      Appearance: Normal appearance.  Cardiovascular:     Rate and Rhythm: Regular rhythm.     Heart sounds: Normal heart sounds.  Pulmonary:     Effort: Pulmonary effort is normal.     Breath sounds: Normal breath sounds.  Skin:    General: Skin is warm and dry.  Neurological:     General: No focal deficit present.     Mental Status: He is alert. Mental status is at baseline.  Psychiatric:        Mood and Affect: Mood normal.         Behavior: Behavior normal.        Thought Content: Thought content normal.        Judgment: Judgment normal.        Assessment & Plan:   Problem List Items Addressed This Visit     Major depressive disorder with current active episode   Relevant Medications   DULoxetine (CYMBALTA) 60 MG capsule   DULoxetine (CYMBALTA) 20  MG capsule   Other Relevant Orders   Ambulatory referral to Psychiatry   Establishing care with new doctor, encounter for - Primary   GAD (generalized anxiety disorder)   Relevant Medications   DULoxetine (CYMBALTA) 60 MG capsule   DULoxetine (CYMBALTA) 20 MG capsule   Other Relevant Orders   Ambulatory referral to Psychiatry  Agrees with plan of care discussed.  Questions answered. Refills sent. Needs to see psychiatry.   Return in about 4 weeks (around 10/23/2022) for cpe with labs depresion and anxiety .   Novella Olive, FNP

## 2022-10-08 DIAGNOSIS — Z419 Encounter for procedure for purposes other than remedying health state, unspecified: Secondary | ICD-10-CM | POA: Diagnosis not present

## 2022-10-26 ENCOUNTER — Ambulatory Visit: Payer: Medicaid Other | Admitting: Family Medicine

## 2022-11-08 DIAGNOSIS — Z419 Encounter for procedure for purposes other than remedying health state, unspecified: Secondary | ICD-10-CM | POA: Diagnosis not present

## 2022-12-09 DIAGNOSIS — Z419 Encounter for procedure for purposes other than remedying health state, unspecified: Secondary | ICD-10-CM | POA: Diagnosis not present

## 2023-05-13 ENCOUNTER — Encounter: Payer: Self-pay | Admitting: Emergency Medicine

## 2023-05-13 ENCOUNTER — Ambulatory Visit
Admission: EM | Admit: 2023-05-13 | Discharge: 2023-05-13 | Disposition: A | Discharge status: Home / Self Care | Payer: Medicaid Other | Attending: Family Medicine | Admitting: Family Medicine

## 2023-05-13 DIAGNOSIS — H6123 Impacted cerumen, bilateral: Secondary | ICD-10-CM

## 2023-05-13 DIAGNOSIS — H6693 Otitis media, unspecified, bilateral: Secondary | ICD-10-CM

## 2023-05-13 MED ORDER — AMOXICILLIN-POT CLAVULANATE 875-125 MG PO TABS: 1.0000 | ORAL_TABLET | Freq: Two times a day (BID) | ORAL | 0 refills | Status: AC

## 2023-05-13 NOTE — Discharge Instructions (Addendum)
Advised patient to take medication as directed with food to completion.  Encouraged to increase daily water intake to 64 ounces per day while taking this medication.  Advised patient not to submerge head underwater for the next 10 to 14 days.  Advised if symptoms worsen and/or unresolved please follow-up with your PCP, ENT, or here for further evaluation.

## 2023-05-13 NOTE — ED Provider Notes (Signed)
[facility]    CSN: [id number] Arrival date & time: 05/13/23  1543      History   Chief Complaint Chief Complaint  Patient presents with   Otalgia    HPI Cesar Peterson is a 23 y.o. male.   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 Authorizing Provider  amoxicillin-clavulanate (AUGMENTIN) 875-125 MG tablet Take 1 tablet by mouth 2 (two) times daily for 10 days. 05/13/23 05/23/23 Yes Trevor Iha, FNP  DULoxetine (CYMBALTA) 20 MG capsule Take 1 capsule (20 mg total) by mouth daily. Take with Cymbalta 60 mg for a total of 80 mg daily 09/25/22   Novella Olive, FNP  DULoxetine (CYMBALTA) 60 MG capsule Take 1 capsule (60 mg total) by mouth daily. Take with Cymbalta 20 mg for a total of 80 mg daily. 09/25/22   Novella Olive, FNP    Family History Family History  Problem Relation Age of Onset   Healthy Mother    Healthy Father     Social History Social History   Tobacco Use   Smoking status: Never    Passive exposure: Never   Smokeless tobacco: Current   Tobacco comments:    vapes  Vaping Use   Vaping status: Every Day  Substance Use Topics   Alcohol use: Never   Drug use: Never     Allergies   Patient has no known allergies.   Review of Systems Review of Systems  HENT:  Positive for ear pain.   All other  systems reviewed and are negative.    Physical Exam Triage Vital Signs ED Triage Vitals  Encounter Vitals Group     BP 05/13/23 1634 135/89     Systolic BP Percentile --      Diastolic BP Percentile --      Pulse Rate 05/13/23 1634 84     Resp 05/13/23 1634 18     Temp 05/13/23 1634 98.1 F (36.7 C)     Temp Source 05/13/23 1634 Oral     SpO2 05/13/23 1634 99 %     Weight --      Height 05/13/23 1636 6' (1.829 m)     Head Circumference --      Peak Flow --      Pain Score 05/13/23 1636 6     Pain Loc --      Pain Education --      Exclude from Growth Chart --    No data found.  Updated Vital Signs BP 135/89 (BP Location: Left Arm)   Pulse 84   Temp 98.1 F (36.7 C) (Oral)   Resp 18   Ht 6' (1.829 m)   SpO2 99%   BMI 28.79 kg/m    Physical Exam Vitals and nursing note  reviewed.  Constitutional:      Appearance: Normal appearance. He is normal weight.  HENT:     Head: Normocephalic and atraumatic.     Right Ear: External ear normal.     Left Ear: External ear normal.     Ears:     Comments: Bilateral EACs occluded with cerumen unable to visualize either TM.  Post bilateral ear lavage: Left EAC-mildly erythematous from previous cerumen impaction, Left TM-red rimmed, retracted; Right EAC-mildly erythematous from previous cerumen impaction, Right TM-bulging, erythematous    Mouth/Throat:     Mouth: Mucous membranes are moist.     Pharynx: Oropharynx is clear.  Eyes:     Extraocular Movements: Extraocular movements intact.     Conjunctiva/sclera: Conjunctivae normal.     Pupils: Pupils are equal, round, and reactive to light.  Cardiovascular:     Rate and Rhythm: Normal rate and regular rhythm.     Pulses: Normal pulses.     Heart sounds: Normal heart sounds.  Pulmonary:     Effort: Pulmonary effort is normal.     Breath sounds: Normal breath sounds. No wheezing, rhonchi or rales.  Musculoskeletal:        General: Normal range of motion.     Cervical back:  Normal range of motion and neck supple.  Skin:    General: Skin is warm and dry.  Neurological:     General: No focal deficit present.     Mental Status: He is alert and oriented to person, place, and time. Mental status is at baseline.  Psychiatric:        Mood and Affect: Mood normal.        Behavior: Behavior normal.      UC Treatments / Results  Labs (all labs ordered are listed, but only abnormal results are displayed) Labs Reviewed - No data to display  EKG   Radiology No results found.  Procedures Procedures (including critical care time)  Medications Ordered in UC Medications - No data to display  Initial Impression / Assessment and Plan / UC Course  I have reviewed the triage vital signs and the nursing notes.  Pertinent labs & imaging results that were available during my care of the patient were reviewed by me and considered in my medical decision making (see chart for details).     MDM: 1.  Acute bilateral otitis media-Rx'd Augmentin 875/125 mg tablet: Take 1 tablet twice daily x 10 days; 2.  Bilateral cerumen impaction-resolved with bilateral ear lavage. Advised patient to take medication as directed with food to completion.  Encouraged to increase daily water intake to 64 ounces per day while taking this medication.  Advised patient not to submerge head underwater for the next 10 to 14 days.  Advised if symptoms worsen and/or unresolved please follow-up with your PCP, ENT, or here for further evaluation.  Patient discharged home, hemodynamically stable. Final Clinical Impressions(s) / UC Diagnoses   Final diagnoses:  Acute bilateral otitis media  Bilateral impacted cerumen     Discharge Instructions      Advised patient to take medication as directed with food to completion.  Encouraged to increase daily water intake to 64 ounces per day while taking this medication.  Advised patient not to submerge head underwater for the next 10 to 14 days.  Advised if  symptoms worsen and/or unresolved please follow-up with your PCP, ENT, or here for further evaluation.     ED Prescriptions     Medication Sig Dispense  Auth. Provider   amoxicillin-clavulanate (AUGMENTIN) 875-125 MG tablet Take 1 tablet by mouth 2 (two) times daily for 10 days. 20 tablet Trevor Iha, FNP      PDMP not reviewed this encounter.   Trevor Iha, FNP 05/13/23 1729

## 2023-05-13 NOTE — ED Triage Notes (Signed)
Patient c/o right ear pain, ache, feels blocked x 2 days.  No other sx's.  Patient did pour peroxide down ear which did not help.

## 2023-08-28 IMAGING — US US EXTREM LOW*L* LIMITED
1 series · 12 of 12 positions shown · non-contrast
Comparison: None Available.

CLINICAL DATA: Left inguinal pain

EXAM:
LEFT LOWER EXTREMITY SOFT TISSUE ULTRASOUND LIMITED
TECHNIQUE: Ultrasound examination was performed including evaluation of the
muscles, tendons, joint, and adjacent soft tissues.

[Series 1: us extrem low bilat ltd · 12 acquisitions, 12 frames shown]
[im 1/12]
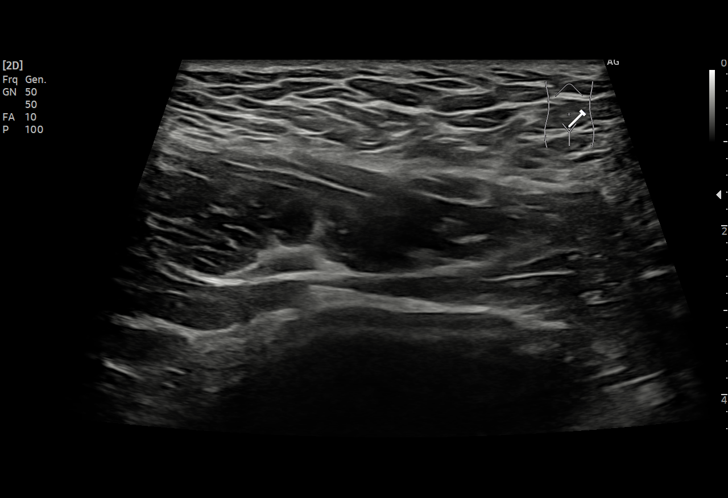
[im 2/12]
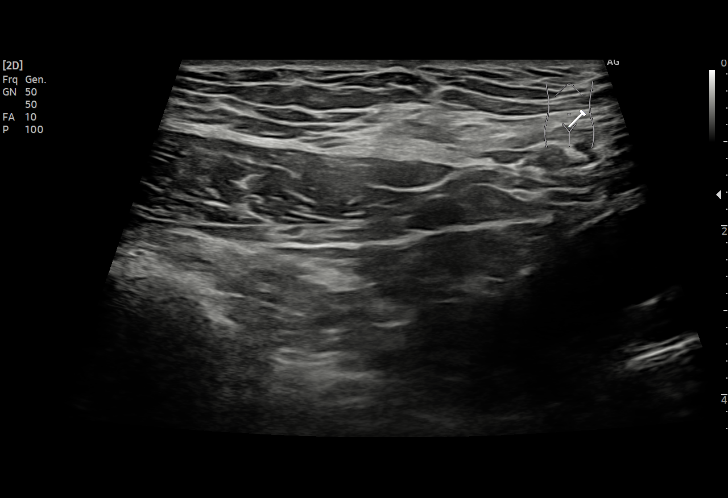
[im 3/12]
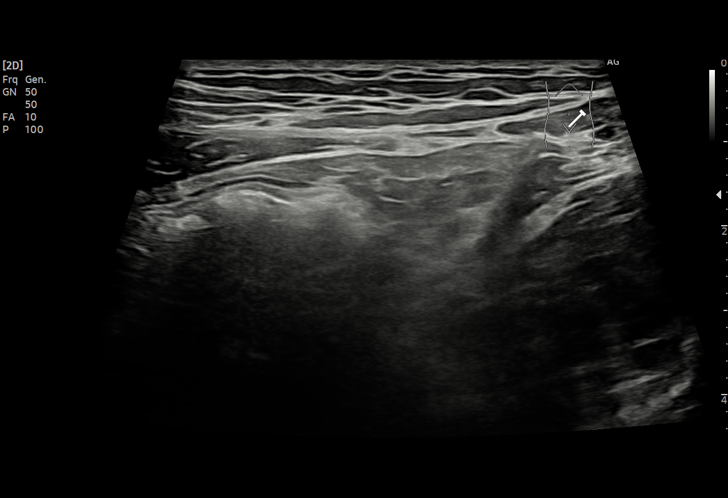
[im 4/12]
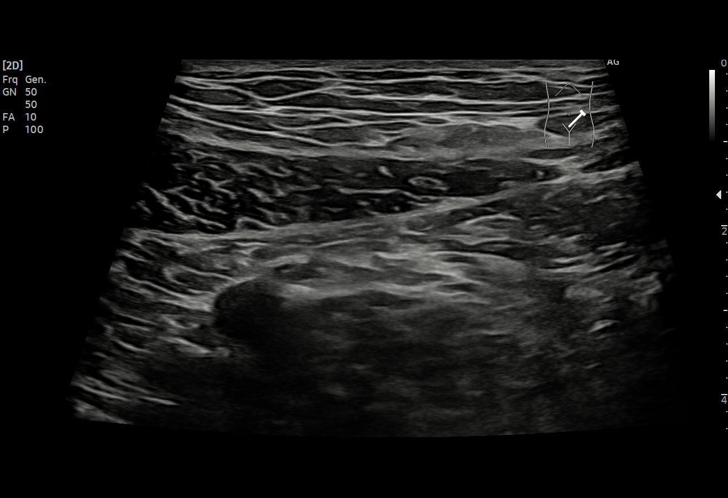
[im 5/12]
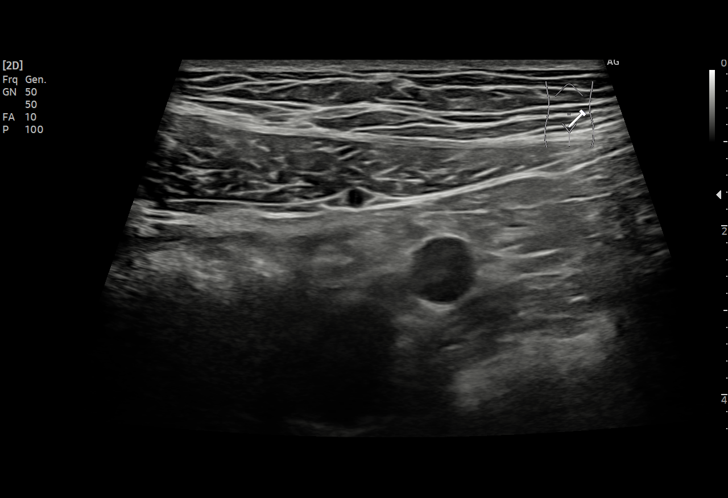
[im 6/12]
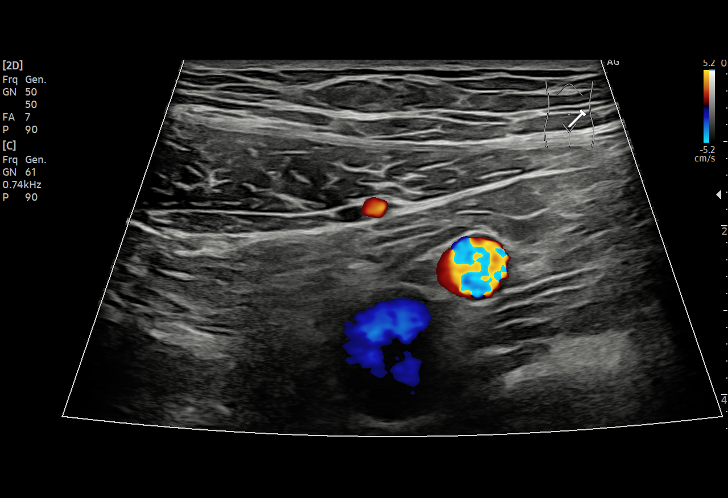
[im 7/12]
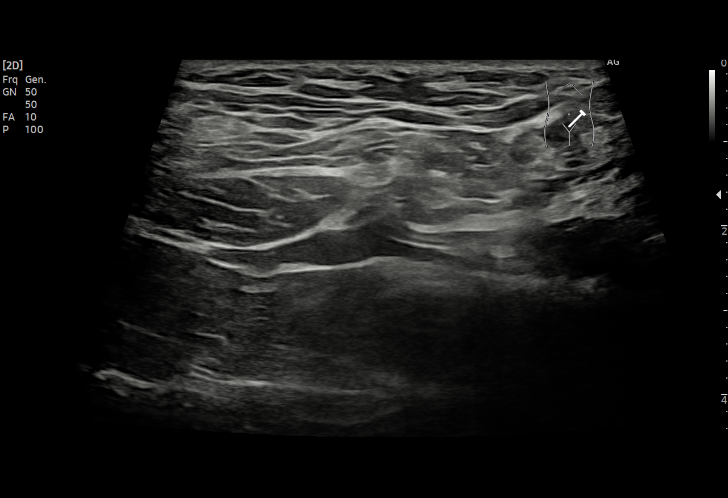
[im 8/12]
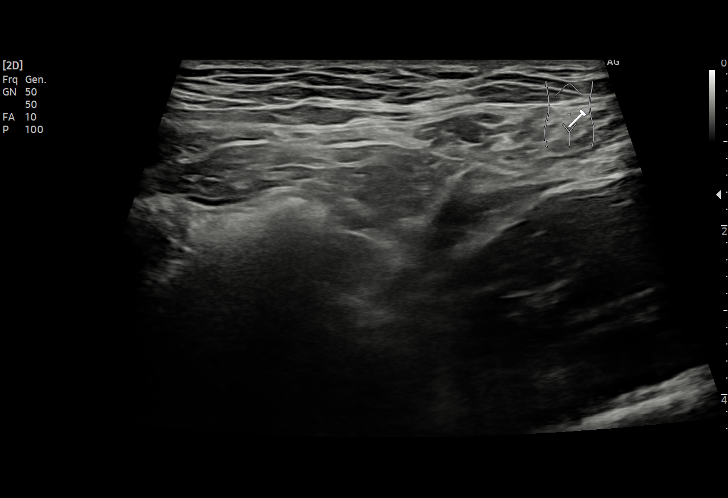
[im 9/12]
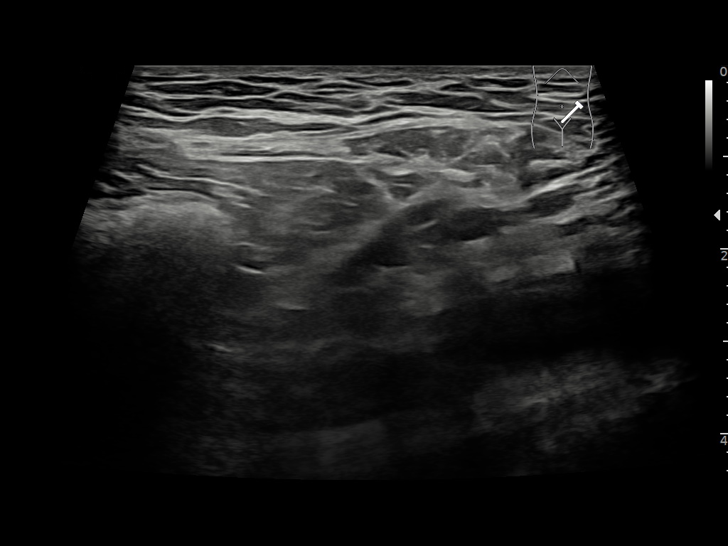
[im 10/12]
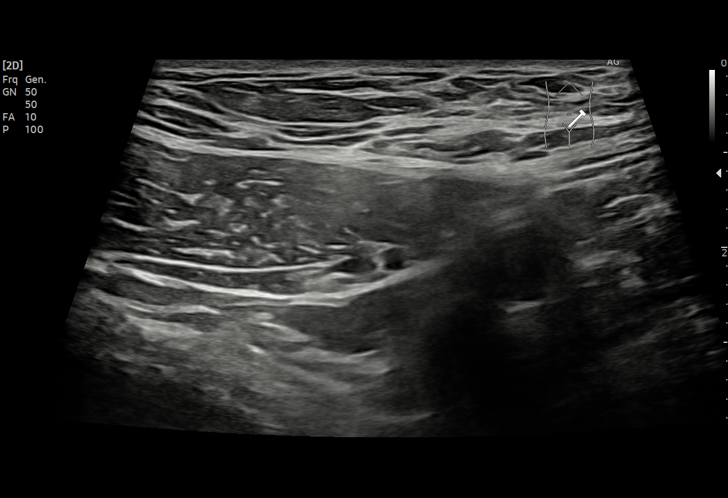
[im 11/12]
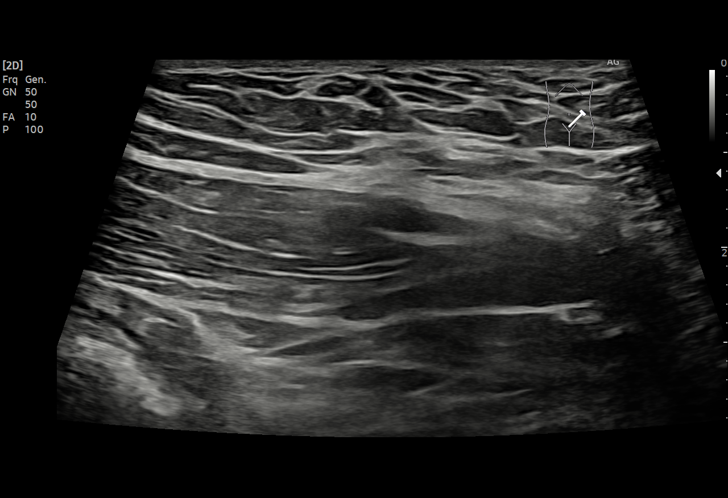
[im 12/12]
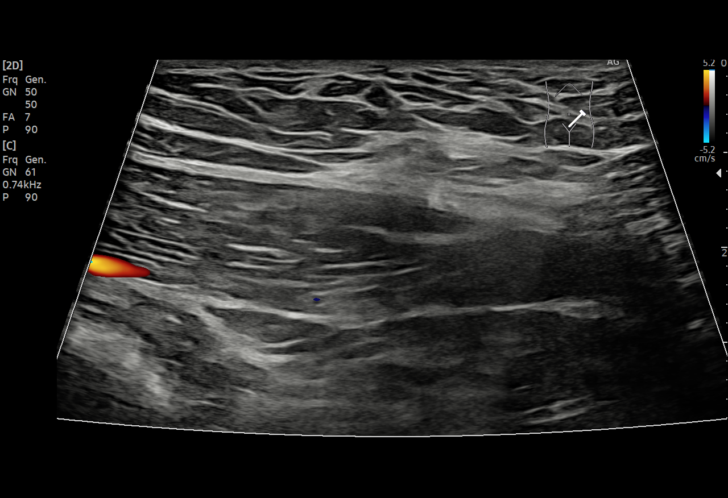

[12 of 12 positions shown; findings below may reference images not displayed]

FINDINGS: Limited soft tissue ultrasound of the area of concern involving the
left groin demonstrates no evidence of soft tissue mass or fluid
collection. No evidence of inguinal hernia with Valsalva maneuver.
IMPRESSION: No soft tissue abnormality seen in the left groin, including no
evidence of inguinal hernia.
